# Patient Record
Sex: Female | Born: 1970 | Race: White | Hispanic: No | Smoking: Former smoker
Health system: Southern US, Community
[De-identification: ages and names within clinical notes are randomized; demographics above are authoritative.]

## PROBLEM LIST (undated history)

## (undated) DIAGNOSIS — E079 Disorder of thyroid, unspecified: Secondary | ICD-10-CM

## (undated) DIAGNOSIS — F329 Major depressive disorder, single episode, unspecified: Secondary | ICD-10-CM

## (undated) DIAGNOSIS — F502 Bulimia nervosa: Secondary | ICD-10-CM

## (undated) DIAGNOSIS — F0390 Unspecified dementia without behavioral disturbance: Secondary | ICD-10-CM

## (undated) DIAGNOSIS — D649 Anemia, unspecified: Secondary | ICD-10-CM

## (undated) DIAGNOSIS — F062 Psychotic disorder with delusions due to known physiological condition: Secondary | ICD-10-CM

## (undated) DIAGNOSIS — F419 Anxiety disorder, unspecified: Secondary | ICD-10-CM

## (undated) DIAGNOSIS — E538 Deficiency of other specified B group vitamins: Secondary | ICD-10-CM

## (undated) DIAGNOSIS — E785 Hyperlipidemia, unspecified: Secondary | ICD-10-CM

## (undated) DIAGNOSIS — E559 Vitamin D deficiency, unspecified: Secondary | ICD-10-CM

## (undated) DIAGNOSIS — F1020 Alcohol dependence, uncomplicated: Secondary | ICD-10-CM

## (undated) DIAGNOSIS — G514 Facial myokymia: Secondary | ICD-10-CM

## (undated) DIAGNOSIS — I739 Peripheral vascular disease, unspecified: Secondary | ICD-10-CM

## (undated) DIAGNOSIS — R443 Hallucinations, unspecified: Secondary | ICD-10-CM

## (undated) DIAGNOSIS — E119 Type 2 diabetes mellitus without complications: Secondary | ICD-10-CM

## (undated) DIAGNOSIS — F4324 Adjustment disorder with disturbance of conduct: Secondary | ICD-10-CM

## (undated) HISTORY — PX: OTHER SURGICAL HISTORY: SHX169

## (undated) HISTORY — DX: Unspecified dementia without behavioral disturbance: F03.90

## (undated) HISTORY — PX: REPLACEMENT TOTAL HIP W/  RESURFACING IMPLANTS: SUR1222

---

## 2013-09-15 ENCOUNTER — Emergency Department (HOSPITAL_COMMUNITY): Payer: Self-pay

## 2013-09-15 ENCOUNTER — Emergency Department (HOSPITAL_COMMUNITY)
Admission: EM | Admit: 2013-09-15 | Discharge: 2013-09-15 | Disposition: A | Discharge status: Home / Self Care | Payer: Self-pay | Attending: Emergency Medicine | Admitting: Emergency Medicine

## 2013-09-15 ENCOUNTER — Encounter (HOSPITAL_COMMUNITY): Payer: Self-pay | Admitting: Emergency Medicine

## 2013-09-15 DIAGNOSIS — R0789 Other chest pain: Secondary | ICD-10-CM | POA: Insufficient documentation

## 2013-09-15 DIAGNOSIS — R531 Weakness: Secondary | ICD-10-CM

## 2013-09-15 DIAGNOSIS — F502 Bulimia nervosa: Secondary | ICD-10-CM

## 2013-09-15 DIAGNOSIS — R5383 Other fatigue: Secondary | ICD-10-CM

## 2013-09-15 DIAGNOSIS — F17200 Nicotine dependence, unspecified, uncomplicated: Secondary | ICD-10-CM | POA: Insufficient documentation

## 2013-09-15 DIAGNOSIS — R079 Chest pain, unspecified: Secondary | ICD-10-CM | POA: Insufficient documentation

## 2013-09-15 DIAGNOSIS — Z79899 Other long term (current) drug therapy: Secondary | ICD-10-CM | POA: Insufficient documentation

## 2013-09-15 DIAGNOSIS — R5381 Other malaise: Secondary | ICD-10-CM | POA: Insufficient documentation

## 2013-09-15 DIAGNOSIS — R630 Anorexia: Secondary | ICD-10-CM | POA: Insufficient documentation

## 2013-09-15 DIAGNOSIS — E079 Disorder of thyroid, unspecified: Secondary | ICD-10-CM | POA: Insufficient documentation

## 2013-09-15 DIAGNOSIS — R632 Polyphagia: Secondary | ICD-10-CM | POA: Insufficient documentation

## 2013-09-15 HISTORY — DX: Bulimia nervosa: F50.2

## 2013-09-15 HISTORY — DX: Disorder of thyroid, unspecified: E07.9

## 2013-09-15 LAB — CBC WITH DIFFERENTIAL/PLATELET
BASOS PCT: 1 % (ref 0–1)
Basophils Absolute: 0.1 10*3/uL (ref 0.0–0.1)
EOS ABS: 0.1 10*3/uL (ref 0.0–0.7)
Eosinophils Relative: 1 % (ref 0–5)
HCT: 33.7 % — ABNORMAL LOW (ref 36.0–46.0)
HEMOGLOBIN: 10.8 g/dL — AB (ref 12.0–15.0)
Lymphocytes Relative: 20 % (ref 12–46)
Lymphs Abs: 1.3 10*3/uL (ref 0.7–4.0)
MCH: 28.4 pg (ref 26.0–34.0)
MCHC: 32 g/dL (ref 30.0–36.0)
MCV: 88.7 fL (ref 78.0–100.0)
MONOS PCT: 14 % — AB (ref 3–12)
Monocytes Absolute: 0.9 10*3/uL (ref 0.1–1.0)
NEUTROS PCT: 65 % (ref 43–77)
Neutro Abs: 4.2 10*3/uL (ref 1.7–7.7)
Platelets: 167 10*3/uL (ref 150–400)
RBC: 3.8 MIL/uL — AB (ref 3.87–5.11)
RDW: 24.1 % — ABNORMAL HIGH (ref 11.5–15.5)
WBC: 6.5 10*3/uL (ref 4.0–10.5)

## 2013-09-15 LAB — BASIC METABOLIC PANEL
Anion gap: 20 — ABNORMAL HIGH (ref 5–15)
BUN: 6 mg/dL (ref 6–23)
CO2: 22 mEq/L (ref 19–32)
CREATININE: 0.43 mg/dL — AB (ref 0.50–1.10)
Calcium: 9.7 mg/dL (ref 8.4–10.5)
Chloride: 96 mEq/L (ref 96–112)
GLUCOSE: 100 mg/dL — AB (ref 70–99)
Potassium: 3.9 mEq/L (ref 3.7–5.3)
Sodium: 138 mEq/L (ref 137–147)

## 2013-09-15 LAB — TROPONIN I: Troponin I: <0.3 ng/mL (ref <0.30)

## 2013-09-15 LAB — MAGNESIUM: MAGNESIUM: 1.8 mg/dL (ref 1.5–2.5)

## 2013-09-15 MED ORDER — SODIUM CHLORIDE 0.9 % IV BOLUS (SEPSIS)
1000.0000 mL | Freq: Once | INTRAVENOUS | Status: AC
  Administered 2013-09-15: 1000 mL via INTRAVENOUS

## 2013-09-15 MED ORDER — ONDANSETRON 4 MG PO TBDP
ORAL_TABLET | ORAL | Status: DC
Start: 2013-09-15 — End: 2017-09-16

## 2013-09-15 NOTE — ED Notes (Signed)
Weak , for 1 year,  Very emaciated  , hx bulimia,  Nausea, intermittent chest pain for 1 year.  Body aches.

## 2013-09-15 NOTE — ED Provider Notes (Signed)
CSN: [id number]     Arrival date & time 09/15/13  1916 History   First MD Initiated Contact with Patient 09/15/13 1940     Chief Complaint  Patient presents with  . Chest Pain     (Consider location/radiation/quality/duration/timing/severity/associated sxs/prior Treatment) HPI Comments: 43 year old female with history of bulimia, malnutrition, thyroid disease presents for worsening general weakness. Patient has had gradually worsening weakness and decreased appetite for the past few months especially past week. Patient is to the point where it's difficult for her to walk without assistance. Patient also said intermittent nonspecific anterior nonradiating chest pain that comes and goes for no specific reason for the past year. No blood clot or cardiac history. No cardiac risk factors except for smoker. No recent surgeries, leg swelling leg pain, fevers or chills.  Patient is a 43 y.o. female presenting with chest pain. The history is provided by the patient and a relative.  Chest Pain Associated symptoms: nausea and weakness   Associated symptoms: no abdominal pain, no back pain, no fever, no headache, no shortness of breath and not vomiting     Past Medical History  Diagnosis Date  . Thyroid disease   . Bulimia    Past Surgical History  Procedure Laterality Date  . Knee surgery     History reviewed. No pertinent family history. History  Substance Use Topics  . Smoking status: Current Every Day Smoker  . Smokeless tobacco: Not on file  . Alcohol Use: Yes   OB History   Grav Para Term Preterm Abortions TAB SAB Ect Mult Living                 Review of Systems  Constitutional: Positive for appetite change. Negative for fever and chills.  HENT: Negative for congestion.   Eyes: Negative for visual disturbance.  Respiratory: Negative for shortness of breath.   Cardiovascular: Positive for chest pain. Negative for leg swelling.  Gastrointestinal: Positive for nausea. Negative  for vomiting and abdominal pain.  Genitourinary: Negative for dysuria and flank pain.  Musculoskeletal: Positive for arthralgias. Negative for back pain, neck pain and neck stiffness.  Skin: Negative for rash.  Neurological: Positive for weakness and light-headedness. Negative for headaches.      Allergies  Review of patient's allergies indicates no known allergies.  Home Medications   Prior to Admission medications   Medication Sig Start Date End Date Taking? Authorizing Provider  Cholecalciferol (VITAMIN D) 2000 UNITS tablet Take 2,000 Units by mouth daily.   Yes Historical Provider, MD  levothyroxine (SYNTHROID, LEVOTHROID) 25 MCG tablet Take 25 mcg by mouth daily before breakfast.   Yes Historical Provider, MD  vitamin B-12 (CYANOCOBALAMIN) 1000 MCG tablet Take 1,000 mcg by mouth daily.   Yes Historical Provider, MD   BP 120/88  Pulse 113  Temp(Src) 98.2 F (36.8 C) (Oral)  Resp 20  Ht  (1.6 m)  Wt 95 lb (43.092 kg)  BMI 16.83 kg/m2  SpO2 98%  LMP 07/16/2013 Physical Exam  Nursing note and vitals reviewed. Constitutional: She is oriented to person, place, and time.  Malnourished appearance, cachectic  HENT:  Head: Normocephalic and atraumatic.  Dry meters membranes  Eyes: Right eye exhibits no discharge. Left eye exhibits no discharge.  Neck: Normal range of motion. Neck supple. No tracheal deviation present.  Cardiovascular: Regular rhythm.  Tachycardia present.   Pulmonary/Chest: Effort normal and breath sounds normal.  Abdominal: Soft. She exhibits no distension. There is no tenderness. There is no guarding.  Musculoskeletal: She exhibits no edema.  Neurological: She is alert and oriented to person, place, and time. No sensory deficit. GCS eye subscore is 4. GCS verbal subscore is 5. GCS motor subscore is 6.  Moves all extremities equal 4+ strength bilateral. Pupils equal bilateral. Patient has mild slowness response to questions but answers most  appropriate. eomfi Neck supple no meningismus  Skin: Skin is warm. No rash noted. There is pallor.  Psychiatric:  Flat affect    ED Course  Procedures (including critical care time) Labs Review Labs Reviewed  BASIC METABOLIC PANEL - Abnormal; Notable for the following:    Glucose, Bld 100 (*)    Creatinine, Ser 0.43 (*)    Anion gap 20 (*)    All other components within normal limits  CBC WITH DIFFERENTIAL - Abnormal; Notable for the following:    RBC 3.80 (*)    Hemoglobin 10.8 (*)    HCT 33.7 (*)    RDW 24.1 (*)    Monocytes Relative 14 (*)    All other components within normal limits  TROPONIN I  MAGNESIUM  TSH    Imaging Review Dg Chest 2 View  09/15/2013   CLINICAL DATA:  Weakness.  Chest pain.  EXAM: CHEST  2 VIEW  COMPARISON:  None.  FINDINGS: The heart size is normal. The lungs are clear. The visualized soft tissues and bony thorax are unremarkable.  IMPRESSION: No active cardiopulmonary disease.   Electronically Signed   By: Gennette Pac M.D.   On: 09/15/2013 20:58     EKG Interpretation   Date/Time:  Friday September 15 2013 19:37:52 EDT Ventricular Rate:  91 PR Interval:  135 QRS Duration: 67 QT Interval:  342 QTC Calculation: 421 R Axis:   70 Text Interpretation:  Sinus rhythm Right atrial enlargement Confirmed by  Ulices Maack  MD, Rosemary Pentecost ([id number]) on 09/15/2013 7:41:31 PM      MDM   Final diagnoses:  Atypical chest pain  General weakness  Bulimia   Patient with significant bulimia and non-nourishment history presents with general weakness and atypical bodyaches and chest ache. Plan for screening cardiac and blood work to look for metabolic issues with her bulimia history. IV fluids given.  Patient with mild improvement on recheck with IV fluids. Patient normally uses a walker at home and was able to use a walker in the ER. Discussed with hospitalist who agrees with recommendation for home health and PT at home as no absolute indication for admission over  the weekend. I discussed this plan with family, place order for physical therapy and home nurse assessment, family comfortable with this plan as they would prefer her to be able to stay at home with children if possible. Blood work overall unremarkable, mild anion gap likely hydration related.  Results and differential diagnosis were discussed with the patient/parent/guardian. Close follow up outpatient was discussed, comfortable with the plan.   Medications  sodium chloride 0.9 % bolus 1,000 mL (0 mLs Intravenous Stopped 09/15/13 2253)    Filed Vitals:   09/15/13 1922 09/15/13 2028  BP: 120/88 97/70  Pulse: 113 94  Temp: 98.2 F (36.8 C)   TempSrc: Oral   Resp: 20 18  Height:  (1.6 m)   Weight: 95 lb (43.092 kg)   SpO2: 98% 100%         Enid Skeens, MD 09/16/13 [phone]

## 2013-09-15 NOTE — Discharge Instructions (Signed)
Home health will contact you. Zofran for nausea.  Continue nutrition as tolerated, try shakes, et Karie Soda.  If you were given medicines take as directed.  If you are on coumadin or contraceptives realize their levels and effectiveness is altered by many different medicines.  If you have any reaction (rash, tongues swelling, other) to the medicines stop taking and see a physician.   Please follow up as directed and return to the ER or see a physician for new or worsening symptoms.  Thank you. Filed Vitals:   09/15/13 1922 09/15/13 2028  BP: 120/88 97/70  Pulse: 113 94  Temp: 98.2 F (36.8 C)   TempSrc: Oral   Resp: 20 18  Height:  (1.6 m)   Weight: 95 lb (43.092 kg)   SpO2: 98% 100%

## 2013-09-16 LAB — TSH: TSH: 8.67 u[IU]/mL — ABNORMAL HIGH (ref 0.350–4.500)

## 2015-10-20 DIAGNOSIS — F0390 Unspecified dementia without behavioral disturbance: Secondary | ICD-10-CM

## 2015-10-20 HISTORY — DX: Unspecified dementia, unspecified severity, without behavioral disturbance, psychotic disturbance, mood disturbance, and anxiety: F03.90

## 2016-01-31 ENCOUNTER — Ambulatory Visit (INDEPENDENT_AMBULATORY_CARE_PROVIDER_SITE_OTHER): Payer: Medicaid Other | Admitting: Family Medicine

## 2016-01-31 ENCOUNTER — Encounter: Payer: Self-pay | Admitting: Family Medicine

## 2016-01-31 VITALS — BP 89/58 | HR 99 | Temp 99.4°F | Ht 63.0 in | Wt 111.0 lb

## 2016-01-31 DIAGNOSIS — E039 Hypothyroidism, unspecified: Secondary | ICD-10-CM | POA: Insufficient documentation

## 2016-01-31 DIAGNOSIS — R5381 Other malaise: Secondary | ICD-10-CM

## 2016-01-31 DIAGNOSIS — F410 Panic disorder [episodic paroxysmal anxiety] without agoraphobia: Secondary | ICD-10-CM

## 2016-01-31 DIAGNOSIS — R7989 Other specified abnormal findings of blood chemistry: Secondary | ICD-10-CM

## 2016-01-31 DIAGNOSIS — F0390 Unspecified dementia without behavioral disturbance: Secondary | ICD-10-CM

## 2016-01-31 DIAGNOSIS — E46 Unspecified protein-calorie malnutrition: Secondary | ICD-10-CM | POA: Insufficient documentation

## 2016-01-31 DIAGNOSIS — F5000 Anorexia nervosa, unspecified: Secondary | ICD-10-CM | POA: Diagnosis not present

## 2016-01-31 DIAGNOSIS — R2689 Other abnormalities of gait and mobility: Secondary | ICD-10-CM | POA: Diagnosis not present

## 2016-01-31 DIAGNOSIS — E43 Unspecified severe protein-calorie malnutrition: Secondary | ICD-10-CM | POA: Diagnosis not present

## 2016-01-31 MED ORDER — MEGESTROL ACETATE 400 MG/10ML PO SUSP: 400.0000 mg | Freq: Two times a day (BID) | ORAL | 2 refills | Status: DC

## 2016-01-31 MED ORDER — ARIPIPRAZOLE 5 MG PO TABS: 5.0000 mg | ORAL_TABLET | Freq: Every day | ORAL | 2 refills | Status: DC

## 2016-01-31 NOTE — Progress Notes (Signed)
Subjective:  Patient ID: Jamiee Milholland, female    DOB: 05/26/1970  Age: 46 y.o. MRN: [id number]  CC: New Patient (Initial Visit) (pt here today to establish care and has just been released from [facility])   HPI Jabria Loos presents for Follow-up on her chronic illnesses. She has a history of alcoholism which led to problems with anorexia and bulimia. Subsequently she became malnourished. She was admitted to [facility] and stayed there for several weeks and then was transferred to nursing facility nearby. She was noted to have deficiency and several major vitamins including ADE and K. She is taking supplements at home now. At one time her weight was down to 50 pounds states her mother who is here with her. Both parents are here and states that she is having memory lapses. This is related back to her alcoholism. Mom states that she still does not eat properly. She will not eat protein foods and she had one half of a sausage biscuit all day today and by lunchtime and about 1 sausage biscuit all day yesterday. She also is having significant problems with dentition. She is located at dentist who will work with her for Medicaid and his plan is to remove all of her teeth and place dentures. Currently she denies shortness of breath and chest pain, abdominal pain nausea vomiting diarrhea. Currently actually she is very debilitated. She is in a wheelchair here. However and her home she is able to slowly move about with a walker and to make transfers on her own.  History Maelys has a past medical history of Bulimia; Dementia (10/2015); and Thyroid disease.   She has a past surgical history that includes Knee surgery.   Her family history includes Arthritis in her father, mother, paternal grandmother, and sister; Asthma in her father; COPD in her mother; Cancer in her father and mother; Depression in her mother; Diabetes in her mother; Hearing loss in her mother; Heart disease in her mother,  paternal grandfather, and paternal grandmother; Hyperlipidemia in her mother; Hypertension in her mother; Kidney disease in her mother; Miscarriages / Stillbirths in her mother and sister; Stroke in her maternal grandmother.She reports that she quit smoking about 6 months ago. She has never used smokeless tobacco. She reports that she drinks alcohol. She reports that she does not use drugs.  Current Outpatient Prescriptions on File Prior to Visit  Medication Sig Dispense Refill  . levothyroxine (SYNTHROID, LEVOTHROID) 25 MCG tablet Take 25 mcg by mouth daily before breakfast.    . ondansetron (ZOFRAN ODT) 4 MG disintegrating tablet '4mg'$  ODT q4 hours prn nausea/vomit 12 tablet 0   No current facility-administered medications on file prior to visit.     ROS Review of Systems  Constitutional: Positive for appetite change (extremely poor very low in protein). Negative for chills, diaphoresis, fatigue, fever and unexpected weight change.  HENT: Negative for congestion, ear pain, hearing loss, postnasal drip, rhinorrhea, sneezing, sore throat and trouble swallowing.   Eyes: Negative for pain.  Respiratory: Negative for cough, chest tightness and shortness of breath.   Cardiovascular: Negative for chest pain and palpitations.  Gastrointestinal: Negative for abdominal pain, constipation, diarrhea, nausea and vomiting.  Endocrine: Negative for cold intolerance, heat intolerance, polydipsia and polyphagia.  Genitourinary: Negative for dysuria and frequency.  Musculoskeletal: Positive for gait problem. Negative for arthralgias and joint swelling.  Skin: Negative for rash.  Allergic/Immunologic: Negative for environmental allergies.  Neurological: Positive for weakness. Negative for dizziness, numbness and headaches.  Psychiatric/Behavioral: Negative for agitation and dysphoric mood.    Objective:  BP (!) 89/58   Pulse 99   Temp 99.4 F (37.4 C) (Oral)   Ht '5\' 3"'$  (1.6 m)   Wt 111 lb (50.3 kg)    LMP  (LMP Unknown)   BMI 19.66 kg/m   Physical Exam  Constitutional: She is oriented to person, place, and time. She appears well-developed and well-nourished. No distress.  HENT:  Head: Normocephalic and atraumatic.  Right Ear: External ear normal.  Left Ear: External ear normal.  Nose: Nose normal.  Mouth/Throat: Oropharynx is clear and moist.  Eyes: Conjunctivae and EOM are normal. Pupils are equal, round, and reactive to light.  Neck: Normal range of motion. Neck supple. No thyromegaly present.  Cardiovascular: Normal rate, regular rhythm and normal heart sounds.   No murmur heard. Pulmonary/Chest: Effort normal and breath sounds normal. No respiratory distress. She has no wheezes. She has no rales. Right breast exhibits no inverted nipple, no mass and no tenderness. Left breast exhibits no inverted nipple, no mass and no tenderness. Breasts are symmetrical.  Abdominal: Soft. Normal appearance and bowel sounds are normal. She exhibits no distension, no abdominal bruit and no mass. There is no splenomegaly or hepatomegaly. There is no tenderness. There is no tenderness at McBurney's point and negative Murphy's sign.  Musculoskeletal: Normal range of motion. She exhibits no edema or tenderness.  Lymphadenopathy:    She has no cervical adenopathy.  Neurological: She is alert and oriented to person, place, and time. She has normal reflexes.  Skin: Skin is warm and dry. No rash noted.  Psychiatric: She has a normal mood and affect. Her behavior is normal. Judgment and thought content normal.    Assessment & Plan:   Kiaja was seen today for new patient (initial visit).  Diagnoses and all orders for this visit:  Protein-calorie malnutrition, severe ([place]) -     Vitamin B12 -     VITAMIN D 25 Hydroxy (Vit-D Deficiency, Fractures) -     Vitamin C -     Vitamin E -     Vitamin K1, Serum -     Vitamin A -     Vitamin B6 -     Vitamin B6 -     CBC with Differential/Platelet -      CMP14+EGFR -     TSH + free T4 -     Lipid panel -     Prealbumin  Dementia without behavioral disturbance, unspecified dementia type  Anorexia nervosa -     Vitamin B12 -     VITAMIN D 25 Hydroxy (Vit-D Deficiency, Fractures) -     Vitamin C -     Vitamin E -     Vitamin K1, Serum -     Vitamin A -     Vitamin B6 -     Vitamin B6 -     CBC with Differential/Platelet -     CMP14+EGFR -     TSH + free T4 -     Lipid panel -     Prealbumin  Acquired hypothyroidism -     TSH + free T4  Panic disorder  Debility -     Ambulatory referral to Physical Therapy  Impairment of balance -     Ambulatory referral to Physical Therapy  Other orders -     ARIPiprazole (ABILIFY) 5 MG tablet; Take 1 tablet (5 mg total) by mouth daily. -  megestrol (MEGACE) 400 MG/10ML suspension; Take 10 mLs (400 mg total) by mouth 2 (two) times daily.   I have discontinued Ms. Wurm's Vitamin D, sertraline, and mirtazapine. I am also having her start on ARIPiprazole and megestrol. Additionally, I am having her maintain her levothyroxine, ondansetron, vitamin E, folic acid, magnesium oxide, THERA, vitamin C, cholecalciferol, vitamin B-12, and hydrOXYzine.  Allergies as of 01/31/2016      Reactions   Aspirin Other (See Comments)   Extreme drowsiness      Medication List       Accurate as of 01/31/16  2:21 PM. Always use your most recent med list.          ARIPiprazole 5 MG tablet Commonly known as:  ABILIFY Take 1 tablet (5 mg total) by mouth daily.   cholecalciferol 1000 units tablet Commonly known as:  VITAMIN D Take 1,000 Units by mouth daily.   folic acid 1 MG tablet Commonly known as:  FOLVITE Take 1 mg by mouth daily.   hydrOXYzine 25 MG tablet Commonly known as:  ATARAX/VISTARIL Take 25 mg by mouth 3 (three) times daily.   levothyroxine 25 MCG tablet Commonly known as:  SYNTHROID, LEVOTHROID Take 25 mcg by mouth daily before breakfast.   magnesium oxide 400 MG  tablet Commonly known as:  MAG-OX Take 400 mg by mouth 2 (two) times daily.   megestrol 400 MG/10ML suspension Commonly known as:  MEGACE Take 10 mLs (400 mg total) by mouth 2 (two) times daily.   ondansetron 4 MG disintegrating tablet Commonly known as:  ZOFRAN ODT '4mg'$  ODT q4 hours prn nausea/vomit   THERA Tabs Take by mouth.   vitamin B-12 1000 MCG tablet Commonly known as:  CYANOCOBALAMIN Take 1,000 mcg by mouth daily.   vitamin C 250 MG tablet Commonly known as:  ASCORBIC ACID Take 250 mg by mouth daily.   vitamin E 400 UNIT capsule Take 400 Units by mouth daily.         Follow-up: Return in about 1 month (around 03/02/2016).  Claretta Fraise, M.D.

## 2016-02-02 ENCOUNTER — Other Ambulatory Visit: Payer: Self-pay | Admitting: Family Medicine

## 2016-02-02 MED ORDER — VITAMIN D (ERGOCALCIFEROL) 1.25 MG (50000 UNIT) PO CAPS: 50000.0000 [IU] | ORAL_CAPSULE | ORAL | 0 refills | Status: DC

## 2016-02-02 MED ORDER — LEVOTHYROXINE SODIUM 50 MCG PO TABS: 50.0000 ug | ORAL_TABLET | Freq: Every day | ORAL | 2 refills | Status: DC

## 2016-02-03 NOTE — Addendum Note (Signed)
Addended by: Almeta MonasSTONE, JANIE M on: 02/03/2016 04:37 PM   Modules accepted: Orders

## 2016-02-04 ENCOUNTER — Telehealth: Payer: Self-pay | Admitting: Family Medicine

## 2016-02-04 ENCOUNTER — Ambulatory Visit: Payer: Self-pay | Admitting: Family

## 2016-02-04 NOTE — Telephone Encounter (Signed)
Please review and advise.

## 2016-02-04 NOTE — Telephone Encounter (Signed)
Pt picked up Abilify, thought there was something to help her sleep. She is waking up having nightmares and panic attacks

## 2016-02-06 LAB — VITAMIN B6: Vitamin B6: 9.1 ug/L (ref 2.0–32.8)

## 2016-02-07 ENCOUNTER — Other Ambulatory Visit: Payer: Self-pay | Admitting: Family Medicine

## 2016-02-07 ENCOUNTER — Telehealth: Payer: Self-pay | Admitting: Family Medicine

## 2016-02-07 LAB — CBC WITH DIFFERENTIAL/PLATELET
BASOS: 0 %
Basophils Absolute: 0 10*3/uL (ref 0.0–0.2)
EOS (ABSOLUTE): 0.2 10*3/uL (ref 0.0–0.4)
EOS: 2 %
HEMATOCRIT: 38.4 % (ref 34.0–46.6)
Hemoglobin: 13 g/dL (ref 11.1–15.9)
IMMATURE GRANULOCYTES: 1 %
Immature Grans (Abs): 0.1 10*3/uL (ref 0.0–0.1)
Lymphocytes Absolute: 1.4 10*3/uL (ref 0.7–3.1)
Lymphs: 18 %
MCH: 30.3 pg (ref 26.6–33.0)
MCHC: 33.9 g/dL (ref 31.5–35.7)
MCV: 90 fL (ref 79–97)
MONOCYTES: 7 %
MONOS ABS: 0.6 10*3/uL (ref 0.1–0.9)
NEUTROS PCT: 72 %
Neutrophils Absolute: 5.6 10*3/uL (ref 1.4–7.0)
Platelets: 302 10*3/uL (ref 150–379)
RBC: 4.29 x10E6/uL (ref 3.77–5.28)
RDW: 13.7 % (ref 12.3–15.4)
WBC: 7.8 10*3/uL (ref 3.4–10.8)

## 2016-02-07 LAB — CMP14+EGFR
A/G RATIO: 1.5 (ref 1.2–2.2)
ALBUMIN: 3.9 g/dL (ref 3.5–5.5)
ALT: 19 IU/L (ref 0–32)
AST: 26 IU/L (ref 0–40)
Alkaline Phosphatase: 156 IU/L — ABNORMAL HIGH (ref 39–117)
BUN/Creatinine Ratio: 15 (ref 9–23)
BUN: 9 mg/dL (ref 6–24)
Bilirubin Total: 0.3 mg/dL (ref 0.0–1.2)
CALCIUM: 8.9 mg/dL (ref 8.7–10.2)
CO2: 23 mmol/L (ref 18–29)
Chloride: 104 mmol/L (ref 96–106)
Creatinine, Ser: 0.6 mg/dL (ref 0.57–1.00)
GFR calc Af Amer: 128 mL/min/{1.73_m2} (ref >59)
GFR, EST NON AFRICAN AMERICAN: 111 mL/min/{1.73_m2} (ref >59)
GLOBULIN, TOTAL: 2.6 g/dL (ref 1.5–4.5)
Glucose: 91 mg/dL (ref 65–99)
Potassium: 4.5 mmol/L (ref 3.5–5.2)
SODIUM: 143 mmol/L (ref 134–144)
Total Protein: 6.5 g/dL (ref 6.0–8.5)

## 2016-02-07 LAB — TSH+FREE T4
Free T4: 0.82 ng/dL (ref 0.82–1.77)
TSH: 5.46 u[IU]/mL — ABNORMAL HIGH (ref 0.450–4.500)

## 2016-02-07 LAB — VITAMIN B6: Vitamin B6: 10.8 ug/L (ref 2.0–32.8)

## 2016-02-07 LAB — VITAMIN D 25 HYDROXY (VIT D DEFICIENCY, FRACTURES): Vit D, 25-Hydroxy: 22.7 ng/mL — ABNORMAL LOW (ref 30.0–100.0)

## 2016-02-07 LAB — PREALBUMIN: PREALBUMIN: 18 mg/dL (ref 12–34)

## 2016-02-07 LAB — VITAMIN B12: Vitamin B-12: 1221 pg/mL (ref 232–1245)

## 2016-02-07 LAB — LIPID PANEL
Chol/HDL Ratio: 3 ratio units (ref 0.0–4.4)
Cholesterol, Total: 202 mg/dL — ABNORMAL HIGH (ref 100–199)
HDL: 67 mg/dL (ref >39)
LDL CALC: 118 mg/dL — AB (ref 0–99)
Triglycerides: 84 mg/dL (ref 0–149)
VLDL CHOLESTEROL CAL: 17 mg/dL (ref 5–40)

## 2016-02-07 LAB — VITAMIN E: VITAMIN E (ALPHA TOCOPHEROL): 23.8 mg/L — AB (ref 5.3–16.8)

## 2016-02-07 LAB — VITAMIN A: Vitamin A: 44 ug/dL (ref 20–65)

## 2016-02-07 LAB — VITAMIN C: Vitamin C: 1.2 mg/dL (ref 0.2–2.0)

## 2016-02-07 NOTE — Telephone Encounter (Signed)
Per pt's father, he and his wife are unable to care for pt at home [facility]

## 2016-02-10 ENCOUNTER — Encounter: Payer: Self-pay | Admitting: Physical Therapy

## 2016-02-10 ENCOUNTER — Ambulatory Visit: Payer: Medicaid Other | Attending: Family Medicine | Admitting: Physical Therapy

## 2016-02-10 DIAGNOSIS — R2681 Unsteadiness on feet: Secondary | ICD-10-CM | POA: Insufficient documentation

## 2016-02-10 DIAGNOSIS — M6281 Muscle weakness (generalized): Secondary | ICD-10-CM | POA: Insufficient documentation

## 2016-02-10 LAB — VITAMIN K1, SERUM: VITAMIN K1: 0.54 ng/mL (ref 0.13–1.88)

## 2016-02-10 NOTE — Therapy (Signed)
[facility] [place], [place], [address] Phone: [phone]   Fax:  [phone]  Physical Therapy Evaluation  Patient Details  Name: Bailey Alexander MRN: [id number] Date of Birth: 1970-01-24 Referring Provider: Claretta Fraise, MD  Encounter Date: 02/10/2016      PT End of Session - 02/10/16 1301    Visit Number 1   Number of Visits 1   Date for PT Re-Evaluation 02/10/16   PT Start Time 1302   PT Stop Time 1345   PT Time Calculation (min) 43 min   Activity Tolerance Patient tolerated treatment well   Behavior During Therapy [facility] for tasks assessed/performed      Past Medical History:  Diagnosis Date  . Bulimia   . Dementia 10/2015  . Thyroid disease     Past Surgical History:  Procedure Laterality Date  . KNEE SURGERY      There were no vitals filed for this visit.       Subjective Assessment - 02/10/16 1309    Subjective Patient states that she has had balance problems for over a year and is presently using a walker. She reports her past alcholism contributed to her weakness. She reports intermitent numbness in B toes.   Pertinent History alcoholism, eating disorders   Patient Stated Goals to get stronger so she can walk without AD   Currently in Pain? No/denies            [facility] PT Assessment - 02/10/16 0001      Assessment   Medical Diagnosis debility, impairment of balance   Referring Provider Claretta Fraise, MD   Onset Date/Surgical Date 01/20/15   Next MD Visit 02/21/16     Precautions   Precautions Fall     Balance Screen   Has the patient fallen in the past 6 months Yes  stood up too fast and got dizzy; malnourished.   How many times? 1   Has the patient had a decrease in activity level because of a fear of falling?  Yes   Is the patient reluctant to leave their home because of a fear of falling?  Yes     Home Environment   Living Environment Private residence   Living Arrangements Parent    Type of [place] Access Stairs to enter   Entrance Stairs-Number of Steps 2   Entrance Stairs-Rails Right   Home Layout Two level   [place] - 2 wheels     Prior Function   Level of Independence Independent with household mobility with device   Vocation Unemployed     Cognition   Overall Cognitive Status Within Functional Limits for tasks assessed     ROM / Strength   AROM / PROM / Strength Strength     Strength   Strength Assessment Site Hip;Knee   Right/Left Hip Right;Left   Right Hip Flexion 4+/5   Right Hip Extension 3+/5   Right Hip ABduction 4-/5   Left Hip Flexion 4+/5   Left Hip Extension 3+/5   Left Hip ABduction 4+/5   Right/Left Knee Right;Left   Right Knee Flexion 4/5   Right Knee Extension 4+/5   Left Knee Flexion 4+/5   Left Knee Extension 5/5     Ambulation/Gait   Ambulation/Gait Yes   Ambulation/Gait Assistance 6: Modified independent (Device/Increase time)   Ambulation Distance (Feet) 30 Feet   Assistive device Rolling walker   Gait Pattern --  decreased heel strike and  L hip IR   Ambulation Surface Level   Gait Comments walker set too high     Balance   Balance Assessed Yes     Standardized Balance Assessment   Standardized Balance Assessment Berg Balance Test     Berg Balance Test   Sit to Stand Able to stand  independently using hands   Standing Unsupported Able to stand 30 seconds unsupported   Sitting with Back Unsupported but Feet Supported on Floor or Stool Able to sit safely and securely 2 minutes   Stand to Sit Controls descent by using hands   Transfers Able to transfer safely, definite need of hands   Standing Unsupported with Eyes Closed Able to stand 10 seconds safely   Standing Ubsupported with Feet Together Needs help to attain position and unable to hold for 15 seconds  12 seconds   From Standing, Reach Forward with Outstretched Arm Loses balance while trying/requires external support   From Standing  Position, Pick up Object from Floor Unable to try/needs assist to keep balance   From Standing Position, Turn to Look Behind Over each Shoulder Turn sideways only but maintains balance   Turn 360 Degrees Needs assistance while turning   Standing Unsupported, Alternately Place Feet on Step/Stool Needs assistance to keep from falling or unable to try   Standing Unsupported, One Foot in Front Loses balance while stepping or standing   Standing on One Leg Unable to try or needs assist to prevent fall   Total Score 21                   [facility] Adult PT Treatment/Exercise - 02/10/16 0001      Exercises   Exercises Knee/Hip     Knee/Hip Exercises: Aerobic   Nustep L1 x 3 min                PT Education - 02/10/16 1514    Education provided Yes   Education Details sit to stand   Person(s) Educated Patient   Methods Explanation;Demonstration;Handout   Comprehension Verbalized understanding;Returned demonstration          PT Short Term Goals - 02/10/16 1517      PT SHORT TERM GOAL #1   Title I with initial HEP   Baseline no HEP   Time 1   Period Days   Status Achieved                  Plan - 02/10/16 1514    Clinical Impression Statement Patient presents with significant functional weakness and endurance deficits. She was unable to peform many tasks on the BERG Balance assessement with a score of 21/56. She ambulates with an abnormal gait pattern while using a RW and has difficulty with transfers. Patient is a fall risk.   Rehab Potential Excellent   PT Frequency One time visit   PT Treatment/Interventions Therapeutic exercise   PT Next Visit Plan Patient limited to one time visit secondary to Medicaid.   PT Home Exercise Plan sit to stand   Consulted and Agree with Plan of Care Patient      Patient will benefit from skilled therapeutic intervention in order to improve the following deficits and impairments:  Abnormal gait, Difficulty walking,  Decreased endurance, Decreased balance, Decreased strength  Visit Diagnosis: Unsteadiness on feet - Plan: PT plan of care cert/re-cert  Muscle weakness (generalized) - Plan: PT plan of care cert/re-cert     Problem List Patient Active Problem List  Diagnosis Date Noted  . Protein-calorie malnutrition, severe ([place]) 01/31/2016  . Anorexia nervosa 01/31/2016  . Acquired hypothyroidism 01/31/2016  . Dementia 10/20/2015    Madelyn Flavors PT 02/10/2016, 3:21 PM  [facility] [address] [place], [place], [address] Phone: [phone]   Fax:  [phone]  Name: Bailey Alexander MRN: [id number] Date of Birth: [birth year]   PHYSICAL THERAPY DISCHARGE SUMMARY  Visits from Start of Care: 1  Current functional level related to goals / functional outcomes: See above   Remaining deficits: See above   Education / Equipment: Sit to stand Plan: Patient agrees to discharge.  Patient goals were met. Patient is being discharged due to financial reasons.  ?????Medicaid restrictions        Madelyn Flavors, [place] 02/10/16 3:23 PM [facility] [address] [place], [place], [address] Phone: [phone]   Fax:  [phone]

## 2016-02-10 NOTE — Patient Instructions (Signed)
Sit to Stand    Sit on edge of chair, feet flat on floor and walker in front of you.  Stand upright, extending knees fully. Use your hands if you need to. Repeat 10 times per set. Do _1-3 sets per session. Do __1-2__ sessions per day.   Solon PalmJulie Dusty Wagoner, PT 02/10/16 1:45 PM [facility] [address] [place], [place], [address] Phone: [phone]   Fax:  [phone]

## 2016-02-11 ENCOUNTER — Ambulatory Visit (INDEPENDENT_AMBULATORY_CARE_PROVIDER_SITE_OTHER): Payer: Medicaid Other | Admitting: Family Medicine

## 2016-02-11 ENCOUNTER — Encounter: Payer: Self-pay | Admitting: Family Medicine

## 2016-02-11 ENCOUNTER — Other Ambulatory Visit: Payer: Self-pay | Admitting: Family Medicine

## 2016-02-11 ENCOUNTER — Telehealth: Payer: Self-pay | Admitting: Family Medicine

## 2016-02-11 VITALS — BP 108/70 | HR 99 | Temp 99.0°F | Ht 63.0 in | Wt 120.0 lb

## 2016-02-11 DIAGNOSIS — R5381 Other malaise: Secondary | ICD-10-CM

## 2016-02-11 DIAGNOSIS — F0390 Unspecified dementia without behavioral disturbance: Secondary | ICD-10-CM

## 2016-02-11 DIAGNOSIS — F5000 Anorexia nervosa, unspecified: Secondary | ICD-10-CM

## 2016-02-11 DIAGNOSIS — E039 Hypothyroidism, unspecified: Secondary | ICD-10-CM

## 2016-02-11 MED ORDER — ARIPIPRAZOLE 10 MG PO TABS: 15.0000 mg | ORAL_TABLET | Freq: Every day | ORAL | 2 refills | Status: DC

## 2016-02-11 MED ORDER — ARIPIPRAZOLE 10 MG PO TABS: 10.0000 mg | ORAL_TABLET | Freq: Every day | ORAL | 2 refills | Status: DC

## 2016-02-11 MED ORDER — MEGESTROL ACETATE 400 MG/10ML PO SUSP: 400.0000 mg | Freq: Two times a day (BID) | ORAL | 2 refills | Status: DC

## 2016-02-11 MED ORDER — TRAZODONE HCL 150 MG PO TABS: 150.0000 mg | ORAL_TABLET | Freq: Every day | ORAL | 2 refills | Status: DC

## 2016-02-11 NOTE — Progress Notes (Signed)
Subjective:  Patient ID: Bailey Alexander, female    DOB: [birth year]  Age: 46 y.o. MRN: [id number]  CC: Altered Mental Status (pt here today with parents and they state she has been very confused and doesn't get off the couch )   HPI [name] presents for Not eating very much. Parents say all aching get relieved somewhat by her to file some food like a hamburger or a couple bites of being just today. Then she lays on the couch all day needs candy. Additionally she is up much of the night moaning and yelling for her husband. They state that she has a very poor memory and that they went to the husband's house and he was there with another woman a few days ago and Madai does not register that she remembers that. Her memory has been poor in other areas. And they feel she does not have any ability to judge for herself to be independent. She did recently start on the higher dose of thyroid disease. She is taking the Abilify. Parents feels they can not coninue to care for her as they are elderly and are exhausted.  History Lindia has a past medical history of Bulimia; Dementia (10/2015); and Thyroid disease.   She has a past surgical history that includes Knee surgery.   Her family history includes Arthritis in her father, mother, paternal grandmother, and sister; Asthma in her father; COPD in her mother; Cancer in her father and mother; Depression in her mother; Diabetes in her mother; Hearing loss in her mother; Heart disease in her mother, paternal grandfather, and paternal grandmother; Hyperlipidemia in her mother; Hypertension in her mother; Kidney disease in her mother; Miscarriages / Stillbirths in her mother and sister; Stroke in her maternal grandmother.She reports that she quit smoking about 6 months ago. She has never used smokeless tobacco. She reports that she drinks alcohol. She reports that she does not use drugs.  Current Outpatient Prescriptions on File Prior to Visit  Medication Sig  Dispense Refill  . cholecalciferol (VITAMIN D) 1000 units tablet Take 1,000 Units by mouth daily.    . hydrOXYzine (ATARAX/VISTARIL) 25 MG tablet Take 25 mg by mouth 3 (three) times daily.    Marland Kitchen levothyroxine (SYNTHROID, LEVOTHROID) 50 MCG tablet Take 1 tablet (50 mcg total) by mouth daily before breakfast. 30 tablet 2  . magnesium oxide (MAG-OX) 400 MG tablet Take 400 mg by mouth 2 (two) times daily.    . Multiple Vitamin (THERA) TABS Take by mouth.    . ondansetron (ZOFRAN ODT) 4 MG disintegrating tablet 4mg  ODT q4 hours prn nausea/vomit 12 tablet 0  . vitamin B-12 (CYANOCOBALAMIN) 1000 MCG tablet Take 1,000 mcg by mouth daily.    . vitamin C (ASCORBIC ACID) 250 MG tablet Take 250 mg by mouth daily.    . Vitamin D, Ergocalciferol, (DRISDOL) 50000 units CAPS capsule Take 1 capsule (50,000 Units total) by mouth 2 (two) times a week. 16 capsule 0  . folic acid (FOLVITE) 1 MG tablet Take 1 mg by mouth daily.     No current facility-administered medications on file prior to visit.     ROS Review of Systems  Constitutional: Positive for appetite change (very poor) and unexpected weight change. Negative for fever.  HENT: Negative.   Eyes: Negative.   Respiratory: Negative.   Cardiovascular: Negative.   Gastrointestinal: Negative.   Endocrine: Negative.   Skin: Negative for rash.  Neurological: Positive for dizziness, weakness and light-headedness. Negative for speech difficulty.  Hematological: Negative for adenopathy. Does not bruise/bleed easily.  Psychiatric/Behavioral: Positive for behavioral problems, confusion, decreased concentration, dysphoric mood and sleep disturbance. Negative for hallucinations and self-injury. The patient is nervous/anxious.     Objective:  BP 108/70   Pulse 99   Temp 99 F (37.2 C) (Oral)   Ht 5\' 3"  (1.6 m)   Wt 120 lb (54.4 kg)   LMP  (LMP Unknown)   BMI 21.26 kg/m   Physical Exam  Constitutional: She is oriented to person, place, and time. No  distress.  Cachectic, sunken hollow eyes. Very quiet and reserved.  HENT:  Head: Normocephalic and atraumatic.  Right Ear: External ear normal.  Left Ear: External ear normal.  Nose: Nose normal.  Mouth/Throat: Oropharynx is clear and moist.  Eyes: Conjunctivae and EOM are normal. Pupils are equal, round, and reactive to light.  Neck: Normal range of motion. Neck supple. No thyromegaly present.  Cardiovascular: Normal rate, regular rhythm and normal heart sounds.   No murmur heard. Pulmonary/Chest: Effort normal and breath sounds normal. No respiratory distress. She has no wheezes. She has no rales.  Abdominal: Soft. Bowel sounds are normal. She exhibits no distension. There is tenderness (mild and diffuse).  Musculoskeletal: She exhibits no edema or tenderness.  Diffuse weakness. Nonfocal. Her range of motion is symmetrically diminished due to debility.  Lymphadenopathy:    She has no cervical adenopathy.  Neurological: She is alert and oriented to person, place, and time. She has normal reflexes.  Skin: Skin is warm and dry.  Psychiatric: Her mood appears anxious. Her affect is blunt and inappropriate. Her speech is not delayed and not tangential. She is slowed and withdrawn. Thought content is paranoid. Cognition and memory are impaired. She expresses impulsivity and inappropriate judgment. She is inattentive.    Assessment & Plan:   Babette Relicammy was seen today for altered mental status.  Diagnoses and all orders for this visit:  Dementia without behavioral disturbance, unspecified dementia type  Anorexia nervosa  Acquired hypothyroidism  Debility  Other orders -     ARIPiprazole (ABILIFY) 10 MG tablet; Take 1 tablet (10 mg total) by mouth daily. -     megestrol (MEGACE) 400 MG/10ML suspension; Take 10 mLs (400 mg total) by mouth 2 (two) times daily. -     traZODone (DESYREL) 150 MG tablet; Take 1 tablet (150 mg total) by mouth at bedtime. For sleep   I have discontinued Ms.  Styles's vitamin E. I have also changed her ARIPiprazole. Additionally, I am having her start on traZODone. Lastly, I am having her maintain her ondansetron, folic acid, magnesium oxide, THERA, vitamin C, cholecalciferol, vitamin B-12, hydrOXYzine, levothyroxine, Vitamin D (Ergocalciferol), and megestrol.  Meds ordered this encounter  Medications  . ARIPiprazole (ABILIFY) 10 MG tablet    Sig: Take 1 tablet (10 mg total) by mouth daily.    Dispense:  30 tablet    Refill:  2  . megestrol (MEGACE) 400 MG/10ML suspension    Sig: Take 10 mLs (400 mg total) by mouth 2 (two) times daily.    Dispense:  600 mL    Refill:  2  . traZODone (DESYREL) 150 MG tablet    Sig: Take 1 tablet (150 mg total) by mouth at bedtime. For sleep    Dispense:  30 tablet    Refill:  2   Patient is incapable of caring for herself. Her caretakers her also unable to continue to care for her. She also would benefit from physical therapy  and a more regimented dietary program where snacks were limited and foods were offered to her nutritious. I believe his admission to [facility] is indicated at this time. Parents are in agreement and will take her there for admissions and review today.  Follow-up: Return in about 1 month (around 03/13/2016).  Mechele Claude, M.D.

## 2016-02-11 NOTE — Telephone Encounter (Signed)
Please contact the patient have her take 15 mg abilify (1.5 tab)

## 2016-02-12 NOTE — Telephone Encounter (Signed)
Aware and verbalizes understanding.  

## 2016-02-13 NOTE — Telephone Encounter (Signed)
FL2  Filled out and on Dr Darlyn ReadStacks office for [employer]

## 2016-03-02 ENCOUNTER — Ambulatory Visit: Payer: Medicaid Other | Admitting: Family Medicine

## 2016-06-11 ENCOUNTER — Other Ambulatory Visit: Payer: Self-pay | Admitting: Otolaryngology

## 2016-06-11 DIAGNOSIS — H7192 Unspecified cholesteatoma, left ear: Secondary | ICD-10-CM

## 2016-06-30 ENCOUNTER — Ambulatory Visit
Admission: RE | Admit: 2016-06-30 | Discharge: 2016-06-30 | Disposition: A | Discharge status: Home / Self Care | Payer: Medicaid Other | Source: Ambulatory Visit | Attending: Otolaryngology | Admitting: Otolaryngology

## 2016-06-30 DIAGNOSIS — H7192 Unspecified cholesteatoma, left ear: Secondary | ICD-10-CM

## 2016-12-15 ENCOUNTER — Other Ambulatory Visit: Payer: Self-pay | Admitting: Hospice and Palliative Medicine

## 2016-12-15 DIAGNOSIS — Z1231 Encounter for screening mammogram for malignant neoplasm of breast: Secondary | ICD-10-CM

## 2017-01-14 ENCOUNTER — Other Ambulatory Visit: Payer: Self-pay | Admitting: Hospice and Palliative Medicine

## 2017-01-14 ENCOUNTER — Ambulatory Visit
Admission: RE | Admit: 2017-01-14 | Discharge: 2017-01-14 | Disposition: A | Discharge status: Home / Self Care | Payer: Medicaid Other | Source: Ambulatory Visit | Attending: Hospice and Palliative Medicine | Admitting: Hospice and Palliative Medicine

## 2017-01-14 DIAGNOSIS — Z1231 Encounter for screening mammogram for malignant neoplasm of breast: Secondary | ICD-10-CM

## 2017-01-14 DIAGNOSIS — N6310 Unspecified lump in the right breast, unspecified quadrant: Secondary | ICD-10-CM

## 2017-01-14 DIAGNOSIS — N6320 Unspecified lump in the left breast, unspecified quadrant: Secondary | ICD-10-CM

## 2017-01-14 DIAGNOSIS — Z872 Personal history of diseases of the skin and subcutaneous tissue: Secondary | ICD-10-CM

## 2017-01-14 DIAGNOSIS — N6011 Diffuse cystic mastopathy of right breast: Secondary | ICD-10-CM

## 2017-01-22 ENCOUNTER — Inpatient Hospital Stay
Admission: RE | Admit: 2017-01-22 | Discharge: 2017-01-22 | Disposition: A | Discharge status: Home / Self Care | Payer: Medicaid Other | Source: Ambulatory Visit | Attending: Hospice and Palliative Medicine | Admitting: Hospice and Palliative Medicine

## 2017-01-22 ENCOUNTER — Inpatient Hospital Stay: Admission: RE | Admit: 2017-01-22 | Payer: Medicaid Other | Source: Ambulatory Visit

## 2017-01-27 ENCOUNTER — Ambulatory Visit
Admission: RE | Admit: 2017-01-27 | Discharge: 2017-01-27 | Disposition: A | Discharge status: Home / Self Care | Payer: Medicaid Other | Source: Ambulatory Visit | Attending: Hospice and Palliative Medicine | Admitting: Hospice and Palliative Medicine

## 2017-01-27 ENCOUNTER — Other Ambulatory Visit: Payer: Self-pay | Admitting: Hospice and Palliative Medicine

## 2017-01-27 DIAGNOSIS — R921 Mammographic calcification found on diagnostic imaging of breast: Secondary | ICD-10-CM

## 2017-01-27 DIAGNOSIS — N6489 Other specified disorders of breast: Secondary | ICD-10-CM

## 2017-01-27 DIAGNOSIS — N6011 Diffuse cystic mastopathy of right breast: Secondary | ICD-10-CM

## 2017-01-27 DIAGNOSIS — N6310 Unspecified lump in the right breast, unspecified quadrant: Secondary | ICD-10-CM

## 2017-01-27 DIAGNOSIS — Z872 Personal history of diseases of the skin and subcutaneous tissue: Secondary | ICD-10-CM

## 2017-01-27 DIAGNOSIS — N6320 Unspecified lump in the left breast, unspecified quadrant: Secondary | ICD-10-CM

## 2017-02-02 ENCOUNTER — Other Ambulatory Visit: Payer: Self-pay | Admitting: Otolaryngology

## 2017-07-27 ENCOUNTER — Other Ambulatory Visit: Payer: Self-pay | Admitting: Internal Medicine

## 2017-07-27 DIAGNOSIS — R921 Mammographic calcification found on diagnostic imaging of breast: Secondary | ICD-10-CM

## 2017-07-27 DIAGNOSIS — N6489 Other specified disorders of breast: Secondary | ICD-10-CM

## 2017-07-28 ENCOUNTER — Other Ambulatory Visit (HOSPITAL_COMMUNITY): Payer: Self-pay | Admitting: *Deleted

## 2017-08-03 ENCOUNTER — Ambulatory Visit
Admission: RE | Admit: 2017-08-03 | Discharge: 2017-08-03 | Disposition: A | Discharge status: Home / Self Care | Payer: Medicaid Other | Source: Ambulatory Visit | Attending: Internal Medicine | Admitting: Internal Medicine

## 2017-08-03 ENCOUNTER — Other Ambulatory Visit: Payer: Self-pay | Admitting: Internal Medicine

## 2017-08-03 DIAGNOSIS — N6320 Unspecified lump in the left breast, unspecified quadrant: Secondary | ICD-10-CM

## 2017-08-03 DIAGNOSIS — R921 Mammographic calcification found on diagnostic imaging of breast: Secondary | ICD-10-CM

## 2017-08-03 DIAGNOSIS — N6489 Other specified disorders of breast: Secondary | ICD-10-CM

## 2017-09-04 ENCOUNTER — Encounter (HOSPITAL_COMMUNITY): Payer: Self-pay | Admitting: Emergency Medicine

## 2017-09-04 ENCOUNTER — Other Ambulatory Visit: Payer: Self-pay

## 2017-09-04 ENCOUNTER — Emergency Department (HOSPITAL_COMMUNITY)
Admission: EM | Admit: 2017-09-04 | Discharge: 2017-09-04 | Disposition: A | Discharge status: Home / Self Care | Payer: Medicaid Other | Source: Home / Self Care | Attending: Emergency Medicine | Admitting: Emergency Medicine

## 2017-09-04 ENCOUNTER — Emergency Department (HOSPITAL_COMMUNITY): Payer: Medicaid Other

## 2017-09-04 DIAGNOSIS — W1812XA Fall from or off toilet with subsequent striking against object, initial encounter: Secondary | ICD-10-CM

## 2017-09-04 DIAGNOSIS — F0390 Unspecified dementia without behavioral disturbance: Secondary | ICD-10-CM | POA: Insufficient documentation

## 2017-09-04 DIAGNOSIS — Z96643 Presence of artificial hip joint, bilateral: Secondary | ICD-10-CM | POA: Insufficient documentation

## 2017-09-04 DIAGNOSIS — S0003XA Contusion of scalp, initial encounter: Secondary | ICD-10-CM

## 2017-09-04 DIAGNOSIS — Z79899 Other long term (current) drug therapy: Secondary | ICD-10-CM

## 2017-09-04 DIAGNOSIS — E039 Hypothyroidism, unspecified: Secondary | ICD-10-CM

## 2017-09-04 DIAGNOSIS — Y9389 Activity, other specified: Secondary | ICD-10-CM

## 2017-09-04 DIAGNOSIS — Z87891 Personal history of nicotine dependence: Secondary | ICD-10-CM | POA: Insufficient documentation

## 2017-09-04 DIAGNOSIS — T07XXXA Unspecified multiple injuries, initial encounter: Secondary | ICD-10-CM

## 2017-09-04 DIAGNOSIS — Y92121 Bathroom in nursing home as the place of occurrence of the external cause: Secondary | ICD-10-CM | POA: Insufficient documentation

## 2017-09-04 DIAGNOSIS — Y999 Unspecified external cause status: Secondary | ICD-10-CM | POA: Insufficient documentation

## 2017-09-04 DIAGNOSIS — W19XXXA Unspecified fall, initial encounter: Secondary | ICD-10-CM

## 2017-09-04 LAB — URINALYSIS, ROUTINE W REFLEX MICROSCOPIC
Bilirubin Urine: NEGATIVE
GLUCOSE, UA: NEGATIVE mg/dL
HGB URINE DIPSTICK: NEGATIVE
KETONES UR: NEGATIVE mg/dL
Leukocytes, UA: NEGATIVE
Nitrite: NEGATIVE
PH: 8 (ref 5.0–8.0)
Protein, ur: NEGATIVE mg/dL
Specific Gravity, Urine: 1.001 — ABNORMAL LOW (ref 1.005–1.030)

## 2017-09-04 MED ORDER — ACETAMINOPHEN 500 MG PO TABS
1000.0000 mg | ORAL_TABLET | Freq: Once | ORAL | Status: AC
  Administered 2017-09-04: 1000 mg via ORAL
  Filled 2017-09-04: qty 2

## 2017-09-04 NOTE — ED Notes (Signed)
Pt's sister talked with aps worker when call returned.  Voiced no other concerns.

## 2017-09-04 NOTE — ED Notes (Signed)
Call made to APS prior to d/c.  Awaiting call back.
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
## 2017-09-04 NOTE — Discharge Instructions (Addendum)
Your vital signs are within normal limits.  The multiple bruises have been documented.  Please use Tylenol extra strength for soreness.  The CT scan of your head is negative for acute intracranial abnormality or skull fracture.  Please follow-up with Dr.Ariza for recheck.  Return to the emergency department if any changes in condition, problems, or concerns.

## 2017-09-05 ENCOUNTER — Encounter (HOSPITAL_COMMUNITY): Payer: Self-pay | Admitting: Emergency Medicine

## 2017-09-05 ENCOUNTER — Emergency Department (HOSPITAL_COMMUNITY): Payer: Medicaid Other

## 2017-09-05 ENCOUNTER — Inpatient Hospital Stay (HOSPITAL_COMMUNITY)
Admission: EM | Admit: 2017-09-05 | Discharge: 2017-09-16 | DRG: 640 | Disposition: A | Discharge status: Skilled Nursing Facility | Payer: Medicaid Other | Source: Skilled Nursing Facility | Attending: Internal Medicine | Admitting: Internal Medicine

## 2017-09-05 ENCOUNTER — Inpatient Hospital Stay (HOSPITAL_COMMUNITY): Payer: Medicaid Other

## 2017-09-05 ENCOUNTER — Other Ambulatory Visit: Payer: Self-pay

## 2017-09-05 DIAGNOSIS — R571 Hypovolemic shock: Secondary | ICD-10-CM | POA: Diagnosis not present

## 2017-09-05 DIAGNOSIS — E039 Hypothyroidism, unspecified: Secondary | ICD-10-CM | POA: Diagnosis not present

## 2017-09-05 DIAGNOSIS — I9589 Other hypotension: Secondary | ICD-10-CM | POA: Diagnosis not present

## 2017-09-05 DIAGNOSIS — F1011 Alcohol abuse, in remission: Secondary | ICD-10-CM | POA: Diagnosis present

## 2017-09-05 DIAGNOSIS — Z96643 Presence of artificial hip joint, bilateral: Secondary | ICD-10-CM | POA: Diagnosis present

## 2017-09-05 DIAGNOSIS — Z823 Family history of stroke: Secondary | ICD-10-CM | POA: Diagnosis not present

## 2017-09-05 DIAGNOSIS — E876 Hypokalemia: Secondary | ICD-10-CM | POA: Diagnosis present

## 2017-09-05 DIAGNOSIS — Z8249 Family history of ischemic heart disease and other diseases of the circulatory system: Secondary | ICD-10-CM

## 2017-09-05 DIAGNOSIS — N170 Acute kidney failure with tubular necrosis: Secondary | ICD-10-CM | POA: Diagnosis present

## 2017-09-05 DIAGNOSIS — Z841 Family history of disorders of kidney and ureter: Secondary | ICD-10-CM | POA: Diagnosis not present

## 2017-09-05 DIAGNOSIS — Z87891 Personal history of nicotine dependence: Secondary | ICD-10-CM | POA: Diagnosis not present

## 2017-09-05 DIAGNOSIS — E46 Unspecified protein-calorie malnutrition: Secondary | ICD-10-CM | POA: Diagnosis not present

## 2017-09-05 DIAGNOSIS — I340 Nonrheumatic mitral (valve) insufficiency: Secondary | ICD-10-CM | POA: Diagnosis not present

## 2017-09-05 DIAGNOSIS — E871 Hypo-osmolality and hyponatremia: Secondary | ICD-10-CM | POA: Diagnosis not present

## 2017-09-05 DIAGNOSIS — R748 Abnormal levels of other serum enzymes: Secondary | ICD-10-CM | POA: Diagnosis present

## 2017-09-05 DIAGNOSIS — Z8659 Personal history of other mental and behavioral disorders: Secondary | ICD-10-CM

## 2017-09-05 DIAGNOSIS — Z79890 Hormone replacement therapy (postmenopausal): Secondary | ICD-10-CM | POA: Diagnosis not present

## 2017-09-05 DIAGNOSIS — Z6826 Body mass index (BMI) 26.0-26.9, adult: Secondary | ICD-10-CM

## 2017-09-05 DIAGNOSIS — Z803 Family history of malignant neoplasm of breast: Secondary | ICD-10-CM | POA: Diagnosis not present

## 2017-09-05 DIAGNOSIS — Z825 Family history of asthma and other chronic lower respiratory diseases: Secondary | ICD-10-CM | POA: Diagnosis not present

## 2017-09-05 DIAGNOSIS — Z886 Allergy status to analgesic agent status: Secondary | ICD-10-CM

## 2017-09-05 DIAGNOSIS — Z8261 Family history of arthritis: Secondary | ICD-10-CM

## 2017-09-05 DIAGNOSIS — Z833 Family history of diabetes mellitus: Secondary | ICD-10-CM | POA: Diagnosis not present

## 2017-09-05 DIAGNOSIS — I214 Non-ST elevation (NSTEMI) myocardial infarction: Secondary | ICD-10-CM

## 2017-09-05 DIAGNOSIS — D638 Anemia in other chronic diseases classified elsewhere: Secondary | ICD-10-CM | POA: Diagnosis present

## 2017-09-05 DIAGNOSIS — Z818 Family history of other mental and behavioral disorders: Secondary | ICD-10-CM | POA: Diagnosis not present

## 2017-09-05 DIAGNOSIS — E875 Hyperkalemia: Secondary | ICD-10-CM | POA: Diagnosis not present

## 2017-09-05 DIAGNOSIS — F329 Major depressive disorder, single episode, unspecified: Secondary | ICD-10-CM | POA: Diagnosis present

## 2017-09-05 DIAGNOSIS — R6521 Severe sepsis with septic shock: Secondary | ICD-10-CM | POA: Diagnosis not present

## 2017-09-05 DIAGNOSIS — N179 Acute kidney failure, unspecified: Secondary | ICD-10-CM | POA: Diagnosis not present

## 2017-09-05 DIAGNOSIS — R911 Solitary pulmonary nodule: Secondary | ICD-10-CM | POA: Diagnosis present

## 2017-09-05 DIAGNOSIS — A419 Sepsis, unspecified organism: Secondary | ICD-10-CM | POA: Diagnosis not present

## 2017-09-05 DIAGNOSIS — E861 Hypovolemia: Secondary | ICD-10-CM | POA: Diagnosis not present

## 2017-09-05 DIAGNOSIS — G9340 Encephalopathy, unspecified: Secondary | ICD-10-CM | POA: Diagnosis not present

## 2017-09-05 DIAGNOSIS — K920 Hematemesis: Secondary | ICD-10-CM

## 2017-09-05 DIAGNOSIS — Z8349 Family history of other endocrine, nutritional and metabolic diseases: Secondary | ICD-10-CM | POA: Diagnosis not present

## 2017-09-05 DIAGNOSIS — F0390 Unspecified dementia without behavioral disturbance: Secondary | ICD-10-CM | POA: Diagnosis not present

## 2017-09-05 DIAGNOSIS — R579 Shock, unspecified: Secondary | ICD-10-CM | POA: Diagnosis not present

## 2017-09-05 DIAGNOSIS — E119 Type 2 diabetes mellitus without complications: Secondary | ICD-10-CM | POA: Diagnosis present

## 2017-09-05 LAB — ECHOCARDIOGRAM COMPLETE
HEIGHTINCHES: 64 in
WEIGHTICAEL: 2507.95 [oz_av]

## 2017-09-05 LAB — BLOOD GAS, ARTERIAL
ACID-BASE DEFICIT: 1.1 mmol/L (ref 0.0–2.0)
Bicarbonate: 24.4 mmol/L (ref 20.0–28.0)
Drawn by: 105551
FIO2: 28
O2 Saturation: 97.7 %
PCO2 ART: 27.6 mmHg — AB (ref 32.0–48.0)
PO2 ART: 97.7 mmHg (ref 83.0–108.0)
pH, Arterial: 7.506 — ABNORMAL HIGH (ref 7.350–7.450)

## 2017-09-05 LAB — COMPREHENSIVE METABOLIC PANEL
ALBUMIN: 2.6 g/dL — AB (ref 3.5–5.0)
ALK PHOS: 70 U/L (ref 38–126)
ALT: 37 U/L (ref 0–44)
ALT: 24 U/L (ref 0–44)
ANION GAP: 15 (ref 5–15)
AST: 71 U/L — ABNORMAL HIGH (ref 15–41)
AST: 54 U/L — ABNORMAL HIGH (ref 15–41)
Albumin: 3.4 g/dL — ABNORMAL LOW (ref 3.5–5.0)
Alkaline Phosphatase: 80 U/L (ref 38–126)
Anion gap: 15 (ref 5–15)
BUN: <5 mg/dL — ABNORMAL LOW (ref 6–20)
BUN: <5 mg/dL — ABNORMAL LOW (ref 6–20)
CALCIUM: 6.7 mg/dL — AB (ref 8.9–10.3)
CHLORIDE: 72 mmol/L — AB (ref 98–111)
CO2: 19 mmol/L — ABNORMAL LOW (ref 22–32)
CO2: 24 mmol/L (ref 22–32)
Calcium: 7.9 mg/dL — ABNORMAL LOW (ref 8.9–10.3)
Chloride: 90 mmol/L — ABNORMAL LOW (ref 98–111)
Creatinine, Ser: 0.48 mg/dL (ref 0.44–1.00)
Creatinine, Ser: 0.44 mg/dL (ref 0.44–1.00)
GFR calc non Af Amer: >60 mL/min (ref >60)
GFR calc non Af Amer: >60 mL/min (ref >60)
GLUCOSE: 143 mg/dL — AB (ref 70–99)
Glucose, Bld: 109 mg/dL — ABNORMAL HIGH (ref 70–99)
POTASSIUM: 2 mmol/L — AB (ref 3.5–5.1)
Potassium: 3.4 mmol/L — ABNORMAL LOW (ref 3.5–5.1)
SODIUM: 111 mmol/L — AB (ref 135–145)
Sodium: 124 mmol/L — ABNORMAL LOW (ref 135–145)
TOTAL PROTEIN: 4.7 g/dL — AB (ref 6.5–8.1)
Total Bilirubin: 1.8 mg/dL — ABNORMAL HIGH (ref 0.3–1.2)
Total Bilirubin: 2 mg/dL — ABNORMAL HIGH (ref 0.3–1.2)
Total Protein: 6.1 g/dL — ABNORMAL LOW (ref 6.5–8.1)

## 2017-09-05 LAB — BASIC METABOLIC PANEL
ANION GAP: 12 (ref 5–15)
ANION GAP: 11 (ref 5–15)
Anion gap: 9 (ref 5–15)
Anion gap: 18 — ABNORMAL HIGH (ref 5–15)
BUN: <5 mg/dL — ABNORMAL LOW (ref 6–20)
CALCIUM: 7.2 mg/dL — AB (ref 8.9–10.3)
CALCIUM: 7.4 mg/dL — AB (ref 8.9–10.3)
CALCIUM: 7.8 mg/dL — AB (ref 8.9–10.3)
CHLORIDE: 81 mmol/L — AB (ref 98–111)
CO2: 21 mmol/L — ABNORMAL LOW (ref 22–32)
CO2: 22 mmol/L (ref 22–32)
CO2: 14 mmol/L — ABNORMAL LOW (ref 22–32)
CO2: 18 mmol/L — ABNORMAL LOW (ref 22–32)
CREATININE: 0.83 mg/dL (ref 0.44–1.00)
Calcium: 6.7 mg/dL — ABNORMAL LOW (ref 8.9–10.3)
Chloride: 91 mmol/L — ABNORMAL LOW (ref 98–111)
Chloride: 97 mmol/L — ABNORMAL LOW (ref 98–111)
Chloride: 102 mmol/L (ref 98–111)
Creatinine, Ser: 0.35 mg/dL — ABNORMAL LOW (ref 0.44–1.00)
Creatinine, Ser: 0.53 mg/dL (ref 0.44–1.00)
Creatinine, Ser: 0.71 mg/dL (ref 0.44–1.00)
GFR calc Af Amer: >60 mL/min (ref >60)
GFR calc Af Amer: >60 mL/min (ref >60)
GLUCOSE: 174 mg/dL — AB (ref 70–99)
GLUCOSE: 146 mg/dL — AB (ref 70–99)
Glucose, Bld: 110 mg/dL — ABNORMAL HIGH (ref 70–99)
Glucose, Bld: 161 mg/dL — ABNORMAL HIGH (ref 70–99)
POTASSIUM: 1.9 mmol/L — AB (ref 3.5–5.1)
Potassium: 3.3 mmol/L — ABNORMAL LOW (ref 3.5–5.1)
Potassium: 4.8 mmol/L (ref 3.5–5.1)
Potassium: 4.4 mmol/L (ref 3.5–5.1)
SODIUM: 111 mmol/L — AB (ref 135–145)
Sodium: 125 mmol/L — ABNORMAL LOW (ref 135–145)
Sodium: 129 mmol/L — ABNORMAL LOW (ref 135–145)
Sodium: 131 mmol/L — ABNORMAL LOW (ref 135–145)

## 2017-09-05 LAB — VITAMIN B12: Vitamin B-12: 1188 pg/mL — ABNORMAL HIGH (ref 180–914)

## 2017-09-05 LAB — POCT I-STAT, CHEM 8
BUN: <3 mg/dL — ABNORMAL LOW (ref 6–20)
CALCIUM ION: 0.83 mmol/L — AB (ref 1.15–1.40)
CHLORIDE: 84 mmol/L — AB (ref 98–111)
Creatinine, Ser: 0.3 mg/dL — ABNORMAL LOW (ref 0.44–1.00)
Glucose, Bld: 160 mg/dL — ABNORMAL HIGH (ref 70–99)
HEMATOCRIT: 45 % (ref 36.0–46.0)
Hemoglobin: 15.3 g/dL — ABNORMAL HIGH (ref 12.0–15.0)
Potassium: 2.7 mmol/L — CL (ref 3.5–5.1)
SODIUM: 118 mmol/L — AB (ref 135–145)
TCO2: 21 mmol/L — AB (ref 22–32)

## 2017-09-05 LAB — PROTIME-INR
INR: 1.01
Prothrombin Time: 13.2 seconds (ref 11.4–15.2)

## 2017-09-05 LAB — URINALYSIS, ROUTINE W REFLEX MICROSCOPIC
BILIRUBIN URINE: NEGATIVE
GLUCOSE, UA: NEGATIVE mg/dL
HGB URINE DIPSTICK: NEGATIVE
Ketones, ur: NEGATIVE mg/dL
Leukocytes, UA: NEGATIVE
Nitrite: NEGATIVE
PH: 7 (ref 5.0–8.0)
Protein, ur: NEGATIVE mg/dL
SPECIFIC GRAVITY, URINE: 1.001 — AB (ref 1.005–1.030)

## 2017-09-05 LAB — TROPONIN I
TROPONIN I: 0.89 ng/mL — AB (ref <0.03)
TROPONIN I: 0.64 ng/mL — AB (ref <0.03)
Troponin I: 1.22 ng/mL (ref <0.03)
Troponin I: 0.45 ng/mL (ref <0.03)
Troponin I: 0.52 ng/mL (ref <0.03)

## 2017-09-05 LAB — GLUCOSE, CAPILLARY
GLUCOSE-CAPILLARY: 157 mg/dL — AB (ref 70–99)
GLUCOSE-CAPILLARY: 156 mg/dL — AB (ref 70–99)
Glucose-Capillary: 148 mg/dL — ABNORMAL HIGH (ref 70–99)
Glucose-Capillary: 138 mg/dL — ABNORMAL HIGH (ref 70–99)

## 2017-09-05 LAB — I-STAT BETA HCG BLOOD, ED (MC, WL, AP ONLY)

## 2017-09-05 LAB — POCT I-STAT 3, ART BLOOD GAS (G3+)
ACID-BASE DEFICIT: 2 mmol/L (ref 0.0–2.0)
Bicarbonate: 23.2 mmol/L (ref 20.0–28.0)
O2 SAT: 47 %
TCO2: 24 mmol/L (ref 22–32)
pCO2 arterial: 38.6 mmHg (ref 32.0–48.0)
pH, Arterial: 7.387 (ref 7.350–7.450)
pO2, Arterial: 26 mmHg — CL (ref 83.0–108.0)

## 2017-09-05 LAB — OSMOLALITY: OSMOLALITY: 254 mosm/kg — AB (ref 275–295)

## 2017-09-05 LAB — MRSA PCR SCREENING: MRSA BY PCR: NEGATIVE

## 2017-09-05 LAB — MAGNESIUM
MAGNESIUM: 1.2 mg/dL — AB (ref 1.7–2.4)
MAGNESIUM: 1.9 mg/dL (ref 1.7–2.4)

## 2017-09-05 LAB — CBC
HCT: 30.7 % — ABNORMAL LOW (ref 36.0–46.0)
HEMATOCRIT: 42 % (ref 36.0–46.0)
HEMOGLOBIN: 15.5 g/dL — AB (ref 12.0–15.0)
Hemoglobin: 10.7 g/dL — ABNORMAL LOW (ref 12.0–15.0)
MCH: 29.2 pg (ref 26.0–34.0)
MCH: 29.5 pg (ref 26.0–34.0)
MCHC: 36.9 g/dL — ABNORMAL HIGH (ref 30.0–36.0)
MCHC: 34.9 g/dL (ref 30.0–36.0)
MCV: 79.8 fL (ref 78.0–100.0)
MCV: 83.7 fL (ref 78.0–100.0)
PLATELETS: 276 10*3/uL (ref 150–400)
Platelets: 345 10*3/uL (ref 150–400)
RBC: 5.26 MIL/uL — ABNORMAL HIGH (ref 3.87–5.11)
RBC: 3.67 MIL/uL — ABNORMAL LOW (ref 3.87–5.11)
RDW: 14 % (ref 11.5–15.5)
RDW: 14 % (ref 11.5–15.5)
WBC: 21.1 10*3/uL — AB (ref 4.0–10.5)
WBC: 9.6 10*3/uL (ref 4.0–10.5)

## 2017-09-05 LAB — PROCALCITONIN: PROCALCITONIN: 12.39 ng/mL

## 2017-09-05 LAB — TYPE AND SCREEN
ABO/RH(D): A POS
ABO/RH(D): A POS
Antibody Screen: NEGATIVE
Antibody Screen: NEGATIVE

## 2017-09-05 LAB — CORTISOL: Cortisol, Plasma: 14.4 ug/dL

## 2017-09-05 LAB — CHLORIDE, URINE, RANDOM: Chloride Urine: <15 mmol/L

## 2017-09-05 LAB — AMMONIA: Ammonia: 11 umol/L (ref 9–35)

## 2017-09-05 LAB — NA AND K (SODIUM & POTASSIUM), RAND UR
Potassium Urine: 4 mmol/L
SODIUM UR: 12 mmol/L

## 2017-09-05 LAB — I-STAT CG4 LACTIC ACID, ED: LACTIC ACID, VENOUS: 1.06 mmol/L (ref 0.5–1.9)

## 2017-09-05 LAB — HIV ANTIBODY (ROUTINE TESTING W REFLEX): HIV Screen 4th Generation wRfx: NONREACTIVE

## 2017-09-05 LAB — SAVE SMEAR

## 2017-09-05 LAB — APTT: APTT: 27 s (ref 24–36)

## 2017-09-05 LAB — PHOSPHORUS: Phosphorus: 2.1 mg/dL — ABNORMAL LOW (ref 2.5–4.6)

## 2017-09-05 LAB — LIPASE, BLOOD: Lipase: 21 U/L (ref 11–51)

## 2017-09-05 LAB — SODIUM, URINE, RANDOM: SODIUM UR: 14 mmol/L

## 2017-09-05 LAB — FOLATE: Folate: 8.9 ng/mL (ref >5.9)

## 2017-09-05 LAB — OSMOLALITY, URINE
OSMOLALITY UR: 53 mosm/kg — AB (ref 300–900)
OSMOLALITY UR: 57 mosm/kg — AB (ref 300–900)

## 2017-09-05 LAB — BRAIN NATRIURETIC PEPTIDE: B NATRIURETIC PEPTIDE 5: 465.9 pg/mL — AB (ref 0.0–100.0)

## 2017-09-05 LAB — ABO/RH: ABO/RH(D): A POS

## 2017-09-05 LAB — TSH: TSH: 3.687 u[IU]/mL (ref 0.350–4.500)

## 2017-09-05 LAB — PREALBUMIN: Prealbumin: 9.9 mg/dL — ABNORMAL LOW (ref 18–38)

## 2017-09-05 MED ORDER — MAGNESIUM SULFATE 2 GM/50ML IV SOLN
2.0000 g | Freq: Once | INTRAVENOUS | Status: AC
  Administered 2017-09-05: 2 g via INTRAVENOUS
  Filled 2017-09-05: qty 50

## 2017-09-05 MED ORDER — FOLIC ACID 5 MG/ML IJ SOLN
1.0000 mg | Freq: Every day | INTRAMUSCULAR | Status: DC
  Administered 2017-09-05 – 2017-09-07 (×3): 1 mg via INTRAVENOUS
  Filled 2017-09-05 (×4): qty 0.2

## 2017-09-05 MED ORDER — SODIUM CHLORIDE 0.9 % IV SOLN
250.0000 mL | INTRAVENOUS | Status: DC | PRN
  Administered 2017-09-08: 250 mL via INTRAVENOUS

## 2017-09-05 MED ORDER — SODIUM CHLORIDE 0.9 % IV SOLN
50.0000 ug/h | INTRAVENOUS | Status: DC
  Administered 2017-09-05: 50 ug/h via INTRAVENOUS
  Filled 2017-09-05 (×4): qty 1

## 2017-09-05 MED ORDER — SODIUM CHLORIDE 0.9 % IV SOLN
80.0000 mg | Freq: Once | INTRAVENOUS | Status: AC
  Administered 2017-09-05: 02:00:00 80 mg via INTRAVENOUS
  Filled 2017-09-05: qty 80

## 2017-09-05 MED ORDER — ADULT MULTIVITAMIN W/MINERALS CH
1.0000 | ORAL_TABLET | Freq: Every day | ORAL | Status: DC
  Administered 2017-09-05 – 2017-09-16 (×12): 1 via ORAL
  Filled 2017-09-05 (×11): qty 1

## 2017-09-05 MED ORDER — DEXTROSE 5 % IV SOLN
INTRAVENOUS | Status: AC
  Administered 2017-09-05: 17:00:00 via INTRAVENOUS

## 2017-09-05 MED ORDER — ORAL CARE MOUTH RINSE
15.0000 mL | Freq: Two times a day (BID) | OROMUCOSAL | Status: DC
  Administered 2017-09-05 – 2017-09-08 (×5): 15 mL via OROMUCOSAL

## 2017-09-05 MED ORDER — OCTREOTIDE ACETATE 100 MCG/ML IJ SOLN
INTRAMUSCULAR | Status: AC
  Filled 2017-09-05: qty 1

## 2017-09-05 MED ORDER — OCTREOTIDE ACETATE 500 MCG/ML IJ SOLN
INTRAMUSCULAR | Status: AC
  Filled 2017-09-05: qty 1

## 2017-09-05 MED ORDER — SODIUM CHLORIDE 0.9 % IV BOLUS
1000.0000 mL | Freq: Once | INTRAVENOUS | Status: AC
  Administered 2017-09-05: 1000 mL via INTRAVENOUS

## 2017-09-05 MED ORDER — IOHEXOL 300 MG/ML  SOLN
75.0000 mL | Freq: Once | INTRAMUSCULAR | Status: AC | PRN
  Administered 2017-09-05: 75 mL via INTRAVENOUS

## 2017-09-05 MED ORDER — POTASSIUM CHLORIDE 10 MEQ/100ML IV SOLN
10.0000 meq | INTRAVENOUS | Status: DC
  Filled 2017-09-05 (×2): qty 100

## 2017-09-05 MED ORDER — OCTREOTIDE LOAD VIA INFUSION
50.0000 ug | Freq: Once | INTRAVENOUS | Status: AC
  Administered 2017-09-05: 50 ug via INTRAVENOUS
  Filled 2017-09-05: qty 25

## 2017-09-05 MED ORDER — POTASSIUM CHLORIDE 10 MEQ/100ML IV SOLN
10.0000 meq | INTRAVENOUS | Status: AC
  Administered 2017-09-05 (×4): 10 meq via INTRAVENOUS
  Filled 2017-09-05 (×4): qty 100

## 2017-09-05 MED ORDER — ONDANSETRON HCL 4 MG/2ML IJ SOLN
4.0000 mg | Freq: Once | INTRAMUSCULAR | Status: AC
  Administered 2017-09-05: 4 mg via INTRAVENOUS
  Filled 2017-09-05: qty 2

## 2017-09-05 MED ORDER — PANTOPRAZOLE SODIUM 40 MG IV SOLR
INTRAVENOUS | Status: AC
  Filled 2017-09-05: qty 80

## 2017-09-05 MED ORDER — VANCOMYCIN HCL IN DEXTROSE 1-5 GM/200ML-% IV SOLN
1000.0000 mg | Freq: Two times a day (BID) | INTRAVENOUS | Status: DC
  Administered 2017-09-05 – 2017-09-07 (×5): 1000 mg via INTRAVENOUS
  Filled 2017-09-05 (×5): qty 200

## 2017-09-05 MED ORDER — INSULIN ASPART 100 UNIT/ML ~~LOC~~ SOLN
0.0000 [IU] | SUBCUTANEOUS | Status: DC
  Administered 2017-09-06 – 2017-09-14 (×10): 2 [IU] via SUBCUTANEOUS

## 2017-09-05 MED ORDER — PIPERACILLIN-TAZOBACTAM 3.375 G IVPB
3.3750 g | Freq: Three times a day (TID) | INTRAVENOUS | Status: DC
  Administered 2017-09-05 – 2017-09-09 (×12): 3.375 g via INTRAVENOUS
  Filled 2017-09-05 (×13): qty 50

## 2017-09-05 MED ORDER — LEVOTHYROXINE SODIUM 50 MCG PO TABS
50.0000 ug | ORAL_TABLET | Freq: Every day | ORAL | Status: DC
  Administered 2017-09-05 – 2017-09-16 (×12): 50 ug via ORAL
  Filled 2017-09-05 (×12): qty 1

## 2017-09-05 MED ORDER — PHENYLEPHRINE HCL-NACL 10-0.9 MG/250ML-% IV SOLN
0.0000 ug/min | INTRAVENOUS | Status: DC
  Administered 2017-09-05: 20 ug/min via INTRAVENOUS
  Administered 2017-09-05: 30 ug/min via INTRAVENOUS
  Administered 2017-09-05: 40 ug/min via INTRAVENOUS
  Administered 2017-09-05: 30 ug/min via INTRAVENOUS
  Administered 2017-09-06: 40 ug/min via INTRAVENOUS
  Administered 2017-09-06 – 2017-09-07 (×4): 30 ug/min via INTRAVENOUS
  Filled 2017-09-05 (×12): qty 250

## 2017-09-05 MED ORDER — SODIUM CHLORIDE 0.9 % IV SOLN
8.0000 mg/h | INTRAVENOUS | Status: DC
  Administered 2017-09-05: 8 mg/h via INTRAVENOUS
  Filled 2017-09-05 (×4): qty 80

## 2017-09-05 MED ORDER — VANCOMYCIN HCL IN DEXTROSE 1-5 GM/200ML-% IV SOLN
1000.0000 mg | Freq: Once | INTRAVENOUS | Status: AC
  Administered 2017-09-05: 1000 mg via INTRAVENOUS
  Filled 2017-09-05: qty 200

## 2017-09-05 MED ORDER — POTASSIUM CHLORIDE 10 MEQ/100ML IV SOLN
10.0000 meq | INTRAVENOUS | Status: DC
  Administered 2017-09-05 (×2): 10 meq via INTRAVENOUS
  Filled 2017-09-05: qty 100

## 2017-09-05 MED ORDER — POTASSIUM CHLORIDE CRYS ER 20 MEQ PO TBCR
40.0000 meq | EXTENDED_RELEASE_TABLET | Freq: Once | ORAL | Status: AC
  Administered 2017-09-05: 40 meq via ORAL
  Filled 2017-09-05: qty 2

## 2017-09-05 MED ORDER — MAGNESIUM SULFATE 2 GM/50ML IV SOLN
2.0000 g | Freq: Once | INTRAVENOUS | Status: AC
  Administered 2017-09-05: 2 g via INTRAVENOUS

## 2017-09-05 MED ORDER — THIAMINE HCL 100 MG/ML IJ SOLN
100.0000 mg | Freq: Every day | INTRAMUSCULAR | Status: DC
  Administered 2017-09-05 – 2017-09-07 (×3): 100 mg via INTRAVENOUS
  Filled 2017-09-05 (×3): qty 2

## 2017-09-05 MED ORDER — SODIUM CHLORIDE 0.9 % IV SOLN: INTRAVENOUS | Status: DC

## 2017-09-05 MED ORDER — POTASSIUM CHLORIDE CRYS ER 20 MEQ PO TBCR
40.0000 meq | EXTENDED_RELEASE_TABLET | Freq: Once | ORAL | Status: AC
  Administered 2017-09-05: 40 meq via ORAL
  Filled 2017-09-05 (×2): qty 2

## 2017-09-05 MED ORDER — POTASSIUM PHOSPHATES 15 MMOLE/5ML IV SOLN
10.0000 mmol | Freq: Once | INTRAVENOUS | Status: DC
  Administered 2017-09-05: 10 mmol via INTRAVENOUS
  Filled 2017-09-05: qty 3.33

## 2017-09-05 MED ORDER — PIPERACILLIN-TAZOBACTAM 3.375 G IVPB 30 MIN
3.3750 g | Freq: Once | INTRAVENOUS | Status: AC
  Administered 2017-09-05: 3.375 g via INTRAVENOUS
  Filled 2017-09-05: qty 50

## 2017-09-05 MED ORDER — SODIUM CHLORIDE 0.9 % IV SOLN
1.0000 g | Freq: Once | INTRAVENOUS | Status: AC
  Administered 2017-09-05: 1 g via INTRAVENOUS
  Filled 2017-09-05 (×2): qty 10

## 2017-09-05 NOTE — ED Notes (Signed)
Date and time results received: 09/05/17 2:11 AM    Test: Na+, K+ Critical Value: Na+ 111. K+ 1.9  Name of Provider Notified: Rancour Orders Received? Or Actions Taken?: MD notified

## 2017-09-05 NOTE — ED Notes (Signed)
CRITICAL VALUE ALERT  Critical Value:  Sodium 111  Date & Time Notied:  09/05/17 @ 0130 Provider Notified:Rancour Orders Received/Actions taken: Redraw and Recheck

## 2017-09-05 NOTE — Progress Notes (Addendum)
CAll from RN  - remote location   S: lyte abn - low K, low mag, low calcium , low phos ORiented and wants to eat Low Na - self correcting fast  PCT high   Anti-infectives (From admission, onward)   Start     Dose/Rate Route Frequency Ordered Stop   09/05/17 1000  vancomycin (VANCOCIN) IVPB 1000 mg/200 mL premix     1,000 mg 200 mL/hr over 60 Minutes Intravenous Every 12 hours 09/05/17 0737     09/05/17 1000  piperacillin-tazobactam (ZOSYN) IVPB 3.375 g     3.375 g 12.5 mL/hr over 240 Minutes Intravenous Every 8 hours 09/05/17 0737     09/05/17 0215  vancomycin (VANCOCIN) IVPB 1000 mg/200 mL premix     1,000 mg 200 mL/hr over 60 Minutes Intravenous  Once 09/05/17 0211 09/05/17 0253   09/05/17 0200  piperacillin-tazobactam (ZOSYN) IVPB 3.375 g     3.375 g 100 mL/hr over 30 Minutes Intravenous  Once 09/05/17 0145 09/05/17 0259        PULMONARY Recent Labs  Lab 09/05/17 0300 09/05/17 0710 09/05/17 0931  PHART 7.506*  --  7.387  PCO2ART 27.6*  --  38.6  PO2ART 97.7  --  26.0*  HCO3 24.4  --  23.2  TCO2  --  21* 24  O2SAT 97.7  --  47.0    CBC Recent Labs  Lab 09/05/17 0020 09/05/17 0709 09/05/17 0710  HGB 15.5* 10.7* 15.3*  HCT 42.0 30.7* 45.0  WBC 21.1* 9.6  --   PLT 345 276  --     COAGULATION Recent Labs  Lab 09/05/17 0035  INR 1.01    CARDIAC   Recent Labs  Lab 09/05/17 0035 09/05/17 0138 09/05/17 0709  TROPONINI 1.22* 0.89* 0.64*   No results for input(s): PROBNP in the last 168 hours.   CHEMISTRY Recent Labs  Lab 09/05/17 0020 09/05/17 0138 09/05/17 0709 09/05/17 0710 09/05/17 1055  NA 111* 111* 124* 118* 125*  K 2.0* 1.9* 3.4* 2.7* 3.3*  CL 72* 81* 90* 84* 91*  CO2 24 21* 19*  --  22  GLUCOSE 109* 110* 143* 160* 174*  BUN <5* <5* <5* <3* <5*  CREATININE 0.48 0.35* 0.44 0.30* 0.53  CALCIUM 7.9* 6.7* 6.7*  --  7.2*  MG  --  1.2* 1.9  --   --   PHOS  --   --  2.1*  --   --    Estimated Creatinine Clearance: 85.9 mL/min (by C-G  formula based on SCr of 0.53 mg/dL).   LIVER Recent Labs  Lab 09/05/17 0020 09/05/17 0035 09/05/17 0709  AST 71*  --  54*  ALT 37  --  24  ALKPHOS 80  --  70  BILITOT 1.8*  --  2.0*  PROT 6.1*  --  4.7*  ALBUMIN 3.4*  --  2.6*  INR  --  1.01  --      INFECTIOUS Recent Labs  Lab 09/05/17 0145 09/05/17 0709  LATICACIDVEN 1.06  --   PROCALCITON  --  12.39     ENDOCRINE CBG (last 3)  Recent Labs    09/05/17 0530 09/05/17 0819 09/05/17 1253  GLUCAP 148* 157* 156*         IMAGING x48h  - image(s) personally visualized  -   highlighted in bold Ct Abdomen Pelvis Wo Contrast  Result Date: 09/05/2017 CLINICAL DATA:  Nausea and vomiting with abdominal distention. Elevated creatinine. EXAM: CT ABDOMEN AND PELVIS WITHOUT  CONTRAST TECHNIQUE: Multidetector CT imaging of the abdomen and pelvis was performed following the standard protocol without IV contrast. COMPARISON:  None. FINDINGS: Lower chest: Atelectasis in both lung bases. 5.5 mm nodule in the anterior right middle lung. Hepatobiliary: No focal liver abnormality is seen. No gallstones, gallbladder wall thickening, or biliary dilatation. Pancreas: Unremarkable. No pancreatic ductal dilatation or surrounding inflammatory changes. Spleen: Normal in size without focal abnormality. Adrenals/Urinary Tract: Adrenal glands are unremarkable. Kidneys are normal, without renal calculi, focal lesion, or hydronephrosis. Bladder is unremarkable. Stomach/Bowel: Stomach, small bowel, and colon are mostly decompressed. No inflammatory changes although evaluation of the bowel wall is limited due to under distention. Appendix is normal. Vascular/Lymphatic: Aortic atherosclerosis. No enlarged abdominal or pelvic lymph nodes. Flattening of the inferior vena cava suggest hypovolemia. Reproductive: Uterus and bilateral adnexa are unremarkable. Other: No abdominal wall hernia or abnormality. No abdominopelvic ascites. Musculoskeletal: Schmorl's nodes  at L2 and L3. Degenerative changes in the lumbar spine. Degenerative changes in the left hip. Postoperative right hip arthroplasty. No destructive bone lesions. IMPRESSION: 1. No acute process demonstrated in the abdomen or pelvis. No evidence of bowel obstruction or inflammation. 2. 5.5 mm nodule in the right middle lung. No follow-up needed if patient is low-risk. Non-contrast chest CT can be considered in 12 months if patient is high-risk. This recommendation follows the consensus statement: Guidelines for Management of Incidental Pulmonary Nodules Detected on CT Images: From the Fleischner Society 2017; Radiology 2017; 284:228-243. 3. Aortic atherosclerosis. 4. Flattened IVC suggest hypovolemia. Electronically Signed   By: Burman NievesWilliam  Stevens M.D.   On: 09/05/2017 04:20   Ct Head Wo Contrast  Result Date: 09/04/2017 CLINICAL DATA:  47 year old female with acute headache following fall yesterday. Initial encounter. EXAM: CT HEAD WITHOUT CONTRAST TECHNIQUE: Contiguous axial images were obtained from the base of the skull through the vertex without intravenous contrast. COMPARISON:  06/30/2016 temporal bone CT FINDINGS: Brain: No evidence of acute infarction, hemorrhage, hydrocephalus, extra-axial collection or mass lesion/mass effect. Vascular: No hyperdense vessel or unexpected calcification. Skull: No acute abnormality. Sinuses/Orbits: No acute abnormality. LEFT temporal/mastoid surgical changes again noted with LEFT mastoid effusion. Other: None IMPRESSION: 1. No evidence of acute intracranial abnormality Electronically Signed   By: Harmon PierJeffrey  Hu M.D.   On: 09/04/2017 11:45   Dg Chest Portable 1 View  Result Date: 09/05/2017 CLINICAL DATA:  Coffee-ground emesis. Sepsis. EXAM: PORTABLE CHEST 1 VIEW COMPARISON:  09/15/2013 FINDINGS: Shallow inspiration. Heart size and pulmonary vascularity are normal. No airspace disease or consolidation in the lungs. No blunting of costophrenic angles. No pneumothorax.  Mediastinal contours appear intact. IMPRESSION: No active disease. Electronically Signed   By: Burman NievesWilliam  Stevens M.D.   On: 09/05/2017 02:37   Dg Abd Portable 2 Views  Result Date: 09/05/2017 CLINICAL DATA:  Coffee ground emesis. EXAM: PORTABLE ABDOMEN - 2 VIEW COMPARISON:  None. FINDINGS: Examination is technically limited due to under penetration. As visualized, there does not appear to be any significant large or small bowel distention although gas-filled bowel loops are present. No radiopaque stones are identified. No free intra-abdominal air. Postoperative changes in the right hip. IMPRESSION: Technically limited study. No gross evidence of bowel obstruction. Electronically Signed   By: Burman NievesWilliam  Stevens M.D.   On: 09/05/2017 02:38    A/p  Electrolyte imalance - replace mag, phos, Calcium Low Na - self corectd fast -> monitor mental status High PCT - continue abx Oriented - can eat [place]  On neo     Dr. Kalman ShanMurali Oland Arquette,  M.D., F.C.C.P Pulmonary and Critical Care Medicine Staff Physician, [facility] Director - Interstitial Lung Disease  Program  Pulmonary Fibrosis [facility] [place], [place], [address]  Pager: [phone], If no answer or between  15:00h - 7:00h: call [phone] Telephone: [phone]

## 2017-09-05 NOTE — ED Notes (Signed)
Neosynephrine changed to 25 mcg/hr

## 2017-09-05 NOTE — Progress Notes (Signed)
RT spoke with pulmonology, Bailey BadKathryn Whiteheart, NP, about the patient's venous blood gas results and inability to obtain an ABG. She was satisfied with the results of the VBG with not other tests to be performed at this time.

## 2017-09-05 NOTE — ED Notes (Signed)
Date and time results received: 09/05/17 0219    Test: Troponin Critical Value: 0.89  Name of Provider Notified: Rancour  Orders Received? Or Actions Taken?: MD notified

## 2017-09-05 NOTE — H&P (Signed)
HISTORY & PHYSICAL  Patient Name: Bailey Alexander MRN: [id number] DOB: [birth year]    ADMISSION DATE:  09/05/2017 DATE OF SERVICE: 09/05/2017  CHIEF COMPLAINT: Hyponatremia, hypokalemia   HISTORY OF PRESENT ILLNESS  This 47 y.o. Caucasian female reformed smoker and nursing home resident transferred to [facility] Emergency Department from [facility] for further evaluation and management of severe hyponatremia and hypokalemia.  The patient originally presented to [facility] at the family's request after a reported fall.  The patient was evaluated any pain in the hospital and then released to return back to her nursing facility.  However, in the same day, she experienced another fall, had bouts of emesis and was experiencing confusion and demonstrated combative behavior, prompting return to [facility].    At the time of clinical interview, the patient has arrived at the ICU (62M).  She is on phenylephrine for blood pressure support.  She has been receiving intravenous saline infusions.  She is awake, alert but oriented only to self.  Nonetheless, the patient is able to report that she "pees all the time".  She believes this is the reason why she is in the hospital.  The patient's sister is at the bedside and is able to provide a significant amount of clinical information.  Of note, the patient clearly has multiple possible contributing factors for her hyponatremia.  The patient has a remote history of alcohol abuse.  While she has been sober for over a year now, she continues to drink very large volumes of liquids.  She has a history of profound malnutrition, which basically started when she started using alcohol at the age of 47.  Although she is now sober, she continues to be anorexic.  Indeed, the patient has a history of anorexia and bulimia.  At this time, the chronicity of the patient's hyponatremia is unknown; however, review of available labs through  the electronic medical records suggests that as recently as July 2018, she had a normal serum sodium, normal potassium, normal creatinine (albeit with an abnormally low BUN).  He has a history of alcohol abuse; however, she has been institutionalized since January 2018.  Home medications are listed below and include Abilify and Benadryl.  She is also on Synthroid.   REVIEW OF SYSTEMS Constitutional: History of unintentional weight loss. No night sweats. No fever. No chills. No fatigue. HEENT: No headaches, dysphagia, sore throat, otalgia, nasal congestion, PND CV:  No chest pain, orthopnea, PND, swelling in lower extremities, palpitations GI: She had a bout of emesis today, which was brown in color.  This was conveyed to us from [facility] as suspicious for upper GI bleed; however, the sister points out that this very well may have been brown in color due to caramel coloring of her soft drinks.  No abdominal pain, nausea, vomiting, diarrhea, change in bowel pattern, anorexia Resp: No DOE, rest dyspnea, cough, mucus, hemoptysis, wheezing  GU: no dysuria, change in color of urine, no urgency or frequency.  No flank pain. MS: Walks with a walker.  No joint pain or swelling. No myalgias,  No decreased range of motion.  Psych:  No change in mood or affect. No memory loss. Skin: no rash or lesions.   PAST MEDICAL/SURGICAL/SOCIAL/FAMILY HISTORIES   Past Medical History:  Diagnosis Date  . Bulimia   . Dementia 10/2015  . Thyroid disease     Past Surgical History:  Procedure Laterality Date  . arm surgery Right   .  REPLACEMENT TOTAL HIP W/  RESURFACING IMPLANTS Bilateral     Social History   Tobacco Use  . Smoking status: Former Smoker    Types: Cigarettes    Last attempt to quit: 07/31/2015    Years since quitting: 2.1  . Smokeless tobacco: Never Used  Substance Use Topics  . Alcohol use: Not Currently    Family History  Problem Relation Age of Onset  . Arthritis Mother    . COPD Mother   . Cancer Mother        Roda Shutters Cancer  . Depression Mother        Bipolar  . Diabetes Mother   . Hearing loss Mother   . Heart disease Mother   . Hyperlipidemia Mother   . Hypertension Mother   . Kidney disease Mother   . Miscarriages / [place] Mother   . Arthritis Father   . Asthma Father   . Cancer Father        prostate  . Arthritis Sister   . Breast cancer Sister 74  . Stroke Maternal Grandmother   . Arthritis Paternal Grandmother   . Heart disease Paternal Grandmother   . Heart disease Paternal Grandfather   . Miscarriages / Stillbirths Sister     Allergies  Allergen Reactions  . Aspirin Other (See Comments)    Extreme drowsiness    Prior to Admission medications   Medication Sig Start Date End Date Taking? Authorizing Provider  ARIPiprazole (ABILIFY) 10 MG tablet Take 1.5 tablets (15 mg total) by mouth daily. 02/11/16   Mechele Claude, MD  hydrOXYzine (ATARAX/VISTARIL) 25 MG tablet Take 25 mg by mouth 3 (three) times daily.    [provider]  levothyroxine (SYNTHROID, LEVOTHROID) 50 MCG tablet Take 1 tablet (50 mcg total) by mouth daily before breakfast. 02/02/16   Mechele Claude, MD  magnesium oxide (MAG-OX) 400 MG tablet Take 400 mg by mouth 2 (two) times daily. 07/31/15   [provider]  megestrol (MEGACE) 400 MG/10ML suspension Take 10 mLs (400 mg total) by mouth 2 (two) times daily. 02/11/16   Mechele Claude, MD  Multiple Vitamin (THERA) TABS Take by mouth.    [provider]  ondansetron (ZOFRAN ODT) 4 MG disintegrating tablet 4mg  ODT q4 hours prn nausea/vomit 09/15/13   Blane Ohara, MD  traZODone (DESYREL) 150 MG tablet Take 1 tablet (150 mg total) by mouth at bedtime. For sleep 02/11/16   Mechele Claude, MD  vitamin B-12 (CYANOCOBALAMIN) 1000 MCG tablet Take 1,000 mcg by mouth daily.    [provider]  Vitamin D, Ergocalciferol, (DRISDOL) 50000 units CAPS capsule Take 1 capsule (50,000 Units total) by  mouth 2 (two) times a week. 02/03/16   Mechele Claude, MD    Current Facility-Administered Medications  Medication Dose Route Frequency Provider Last Rate Last Dose  . 0.9 %  sodium chloride infusion   Intravenous Continuous Rancour, Stephen, MD      . 0.9 %  sodium chloride infusion  250 mL Intravenous PRN Tobey Grim, NP      . folic acid injection 1 mg  1 mg Intravenous Daily Jovita Kussmaul M, NP      . levothyroxine (SYNTHROID, LEVOTHROID) tablet 50 mcg  50 mcg Oral QAC breakfast Jovita Kussmaul M, NP      . octreotide (SANDOSTATIN) 500 mcg in sodium chloride 0.9 % 250 mL (2 mcg/mL) infusion  50 mcg/hr Intravenous Continuous Rancour, Jeannett Senior, MD 25 mL/hr at 09/05/17 0156 50 mcg/hr at 09/05/17 0156  .  pantoprazole (PROTONIX) 80 mg in sodium chloride 0.9 % 250 mL (0.32 mg/mL) infusion  8 mg/hr Intravenous Continuous Rancour, Stephen, MD 25 mL/hr at 09/05/17 0216 8 mg/hr at 09/05/17 0216  . phenylephrine (NEOSYNEPHRINE) 10-0.9 MG/250ML-% infusion  0-200 mcg/min Intravenous Titrated Tobey Grim, NP 30 mL/hr at 09/05/17 0232 20 mcg/min at 09/05/17 0232  . potassium chloride 10 mEq in 100 mL IVPB  10 mEq Intravenous Q1 Hr x 4 Eubanks, Katalina M, NP      . thiamine (B-1) injection 100 mg  100 mg Intravenous Daily Tobey Grim, NP        VITAL SIGNS: BP (!) 83/57   Pulse 85   Temp 97.7 F (36.5 C) (Oral)   Resp (!) 25   Ht 5\' 4"  (1.626 m)   Wt 68 kg   LMP  (LMP Unknown)   SpO2 100%   BMI 25.75 kg/m   INTAKE / OUTPUT: No intake/output data recorded.  PHYSICAL EXAMINATION: GENERAL: Awake, alert.  Disoriented to time and place.  Masked facies.  Flat affect.  Cooperative.  No acute distress. HEAD: normocephalic, atraumatic EYE: PERRLA, EOM intact, no scleral icterus, no pallor.  No lid lag. NOSE: nares are patent. No polyps. No exudate.  THROAT/ORAL CAVITY: Edentulous. No oral thrush. No exudate. Mucous membranes are moist. No tonsillar enlargement. Mallampati  class II (hard and soft palate, upper portion of tonsils anduvula visible) airway. NECK: supple, no thyromegaly, no JVD, no lymphadenopathy. Trachea midline. CHEST/LUNG: symmetric in development and expansion. Good air entry. no crackles. No wheezes. HEART: Regular S1 and S2 without murmur, rub or gallop. ABDOMEN: soft, nontender, nondistended. Normoactive bowel sounds. No rebound. No guarding. No hepatosplenomegaly. EXTREMITIES: Edema: Trace and non-pitting. No cyanosis. No clubbing. 2+ DP pulses LYMPHATIC: no cervical/axillary/inguinal lymph nodes appreciated MUSCULOSKELETAL: No point tenderness. Mild bulk atrophy. Joints: no joint tenderness, deformity or swelling. SKIN:  No rash or lesion. NEUROLOGIC: Doll's eyes intact. Corneal reflex intact. Spontaneous respirations intact. Cranial nerves II-XII are grossly symmetric and physiologic. Babinski absent. No sensory deficit. Motor: 5/5 @ RUE, 5/5 @ LUE, 5/5 @ RLL,  5/5 @ LLL.  DTR: 2+ @ R biceps, 2+ @ L biceps, 2+ @ R patellar,  2+ @ L patellar. No cerebellar signs. Gait was not assessed.   LABS:  BASIC METABOLIC PROFILE Recent Labs  Lab 09/05/17 0020 09/05/17 0138  NA 111* 111*  K 2.0* 1.9*  CL 72* 81*  CO2 24 21*  BUN <5* <5*  CREATININE 0.48 0.35*  GLUCOSE 109* 110*  CALCIUM 7.9* 6.7*  MG  --  1.2*    Liver Enzymes Recent Labs  Lab 09/05/17 0020  AST 71*  ALT 37  ALKPHOS 80  BILITOT 1.8*  ALBUMIN 3.4*    CBC Recent Labs  Lab 09/05/17 0020  WBC 21.1*  HGB 15.5*  HCT 42.0  PLT 345    COAGULATION STUDIES Recent Labs  Lab 09/05/17 0035  APTT 27  INR 1.01    SEPSIS MARKERS Recent Labs  Lab 09/05/17 0145  LATICACIDVEN 1.06    ABG Recent Labs  Lab 09/05/17 0300  PHART 7.506*  PCO2ART 27.6*  PO2ART 97.7    Cardiac Enzymes Recent Labs  Lab 09/05/17 0035 09/05/17 0138  TROPONINI 1.22* 0.89*    Imaging Ct Abdomen Pelvis Wo Contrast  Result Date: 09/05/2017 CLINICAL DATA:  Nausea and  vomiting with abdominal distention. Elevated creatinine. EXAM: CT ABDOMEN AND PELVIS WITHOUT CONTRAST TECHNIQUE: Multidetector CT imaging of the abdomen and pelvis  was performed following the standard protocol without IV contrast. COMPARISON:  None. FINDINGS: Lower chest: Atelectasis in both lung bases. 5.5 mm nodule in the anterior right middle lung. Hepatobiliary: No focal liver abnormality is seen. No gallstones, gallbladder wall thickening, or biliary dilatation. Pancreas: Unremarkable. No pancreatic ductal dilatation or surrounding inflammatory changes. Spleen: Normal in size without focal abnormality. Adrenals/Urinary Tract: Adrenal glands are unremarkable. Kidneys are normal, without renal calculi, focal lesion, or hydronephrosis. Bladder is unremarkable. Stomach/Bowel: Stomach, small bowel, and colon are mostly decompressed. No inflammatory changes although evaluation of the bowel wall is limited due to under distention. Appendix is normal. Vascular/Lymphatic: Aortic atherosclerosis. No enlarged abdominal or pelvic lymph nodes. Flattening of the inferior vena cava suggest hypovolemia. Reproductive: Uterus and bilateral adnexa are unremarkable. Other: No abdominal wall hernia or abnormality. No abdominopelvic ascites. Musculoskeletal: Schmorl's nodes at L2 and L3. Degenerative changes in the lumbar spine. Degenerative changes in the left hip. Postoperative right hip arthroplasty. No destructive bone lesions. IMPRESSION: 1. No acute process demonstrated in the abdomen or pelvis. No evidence of bowel obstruction or inflammation. 2. 5.5 mm nodule in the right middle lung. No follow-up needed if patient is low-risk. Non-contrast chest CT can be considered in 12 months if patient is high-risk. This recommendation follows the consensus statement: Guidelines for Management of Incidental Pulmonary Nodules Detected on CT Images: From the Fleischner Society 2017; Radiology 2017; 284:228-243. 3. Aortic atherosclerosis.  4. Flattened IVC suggest hypovolemia. Electronically Signed   By: Burman Nieves M.D.   On: 09/05/2017 04:20   Ct Head Wo Contrast  Result Date: 09/04/2017 CLINICAL DATA:  47 year old female with acute headache following fall yesterday. Initial encounter. EXAM: CT HEAD WITHOUT CONTRAST TECHNIQUE: Contiguous axial images were obtained from the base of the skull through the vertex without intravenous contrast. COMPARISON:  06/30/2016 temporal bone CT FINDINGS: Brain: No evidence of acute infarction, hemorrhage, hydrocephalus, extra-axial collection or mass lesion/mass effect. Vascular: No hyperdense vessel or unexpected calcification. Skull: No acute abnormality. Sinuses/Orbits: No acute abnormality. LEFT temporal/mastoid surgical changes again noted with LEFT mastoid effusion. Other: None IMPRESSION: 1. No evidence of acute intracranial abnormality Electronically Signed   By: Harmon Pier M.D.   On: 09/04/2017 11:45   Dg Chest Portable 1 View  Result Date: 09/05/2017 CLINICAL DATA:  Coffee-ground emesis. Sepsis. EXAM: PORTABLE CHEST 1 VIEW COMPARISON:  09/15/2013 FINDINGS: Shallow inspiration. Heart size and pulmonary vascularity are normal. No airspace disease or consolidation in the lungs. No blunting of costophrenic angles. No pneumothorax. Mediastinal contours appear intact. IMPRESSION: No active disease. Electronically Signed   By: Burman Nieves M.D.   On: 09/05/2017 02:37   Dg Abd Portable 2 Views  Result Date: 09/05/2017 CLINICAL DATA:  Coffee ground emesis. EXAM: PORTABLE ABDOMEN - 2 VIEW COMPARISON:  None. FINDINGS: Examination is technically limited due to under penetration. As visualized, there does not appear to be any significant large or small bowel distention although gas-filled bowel loops are present. No radiopaque stones are identified. No free intra-abdominal air. Postoperative changes in the right hip. IMPRESSION: Technically limited study. No gross evidence of bowel obstruction.  Electronically Signed   By: Burman Nieves M.D.   On: 09/05/2017 02:38    CULTURES: Results for orders placed or performed during the hospital encounter of 09/05/17  Blood culture (routine x 2)     Status: None (Preliminary result)   Collection Time: 09/05/17  2:27 AM  Result Value Ref Range Status   Specimen Description LEFT ANTECUBITAL  Final  Special Requests   Final    BOTTLES DRAWN AEROBIC AND ANAEROBIC Blood Culture adequate volume Performed at [facility], [address] [place], [place] [address]    Culture PENDING  Incomplete   Report Status PENDING  Incomplete  Blood culture (routine x 2)     Status: None (Preliminary result)   Collection Time: 09/05/17  2:36 AM  Result Value Ref Range Status   Specimen Description BLOOD LEFT HAND  Final   Special Requests   Final    BOTTLES DRAWN AEROBIC ONLY Blood Culture results may not be optimal due to an inadequate volume of blood received in culture bottles Performed at [facility], [address] [place], [place] [address]    Culture PENDING  Incomplete   Report Status PENDING  Incomplete     ASSESSMENT / PLAN: Principal Problem:   Hyponatremia Active Problems:   Cardiac enzymes elevated   Hypokalemia   Hypomagnesemia   Malnutrition (HCC)   Acquired hypothyroidism   Pulmonary nodule   By systems:  CARDIOVASCULAR  Elevated troponins  Hypotension Check BNP, lactate. Titrate phenylephrine for MAP 65. 2D echo 12-lead EKG  RENAL/METABOLIC  Hypovolemic hyponatremia, severe (Na 111 initially), not on thiazide  Hypokalemia  Hypomagnesemia Nephrology consultation is advised. Check TSH, cortisol, urine Na, urine Cl, osmolalities (serum, urine). Room air ABG to assess acid-base balance. It is absolutely critical to avoid overly rapid correction of Na in this patient (in addition to avoiding worsening hyponatremia). We will request PICC line insertion in anticipation of needing 3% hypertonic saline for  correction of serum sodium.  Also, I believe clinical nutrition consult is warranted.  Long-term TPN may need to be considered.     INFECTIOUS  Leukocytosis of unknown clinical significance Check procalcitonin   ENDOCRINE  Hypothyroid Check TSH. Continue Synthroid.   NEUROLOGIC  Dementia, probably multifactorial Check B12 and folate. MVI  GASTROINTESTINAL  History of vitamin ADEK deficiencies  Malnutrition  Emesis Check prealbumin. Gastroccult.  Hemoccult. GI prophylaxis: famotidine   PULMONARY  Pulmonary nodule, right middle lobe Curiously, the presence of this nodules raises the question of possible SIADH. Chest CT with IV contrast    HEMATOLOGIC  No acute issues DVT prophylaxis: SCDs.    Admit to ICU under my service (Attending: Marcelle SmilingSeong-Joo Arda Keadle, MD) with the diagnoses highlighted above in the active Hospital Problem List (ASSESSMENT).   FAMILY  - Updates: Sister updated  - Inter-disciplinary family meet or Palliative Care meeting due by:/24/2019  Critical care time: 90 minutes.  The treatment and management of the patient's condition was required based on the threat of imminent deterioration. This time reflects time spent by the physician evaluating, providing care and managing the critically ill patient's care. The time was spent at the immediate bedside (or on the same floor/unit and dedicated to this patient's care). Time involved in separately billable procedures is NOT included int he critical care time indicated above. Family meeting and update time may be included above if and only if the patient is unable/incompetent to participate in clinical interview and/or decision making, and the discussion was necessary to determining treatment decisions.  Marcelle SmilingSeong-Joo Jarreau Callanan, MD Board Certified by the ABIM, Pulmonary Diseases & Critical Care Medicine  Marcelle SmilingSeong-Joo Rilen Shukla, MD Board Certified by the ABIM, Pulmonary Diseases & Critical Care Medicine  [facility] Pulmonary/Critical Care Critical Care Pager: [phone]  09/05/2017, 6:04 AM

## 2017-09-05 NOTE — ED Notes (Addendum)
Urine culture has not yet been completed. Error in charting.

## 2017-09-05 NOTE — Progress Notes (Signed)
Spoke with Junius Roadsroyce RN re PICC order.  Blood cultures drawn this date with  blood pressures low throughout the night.  Troyce RN to speak with physician re  IV access.

## 2017-09-05 NOTE — Progress Notes (Signed)
  Echocardiogram 2D Echocardiogram has been performed.  Bailey SkeenVijay  Debby Alexander 09/05/2017, 9:22 AM

## 2017-09-05 NOTE — Consult Note (Addendum)
Reason for Consult:hyponatremia Referring Physician: Wylodean Shimmel is an 47 y.o. female.  HPI: patient transferred from outside hospital where she was seen for a fall and found to have severe hyponatremia and hypokalemia.  She has dementia at baseline.    She remains oriented to self only, she denies any pain or headaches, she endorses dizziness and chills but denies dysuria.     Past Medical History:  Diagnosis Date  . Bulimia   . Dementia 10/2015  . Thyroid disease     Past Surgical History:  Procedure Laterality Date  . arm surgery Right   . 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 Arthritis Mother   . COPD Mother   . Cancer Mother        Veatrice Bourbon Cancer  . Depression Mother        Bipolar  . Diabetes Mother   . Hearing loss Mother   . Heart disease Mother   . Hyperlipidemia Mother   . Hypertension Mother   . Kidney disease Mother   . Miscarriages / [place] Mother   . Arthritis Father   . Asthma Father   . Cancer Father        prostate  . Arthritis Sister   . Breast cancer Sister 26  . Stroke Maternal Grandmother   . Arthritis Paternal Grandmother   . Heart disease Paternal Grandmother   . Heart disease Paternal Grandfather   . Miscarriages / [place] Sister     Social History:  reports that she quit smoking about 2 years ago. Her smoking use included cigarettes. She has never used smokeless tobacco. She reports that she drank alcohol. She reports that she does not use drugs.  Allergies:  Allergies  Allergen Reactions  . Aspirin Other (See Comments)    Extreme drowsiness    Medications: I have reviewed the patient's current medications.   Results for orders placed or performed during the hospital encounter of 09/05/17 (from the past 48 hour(s))  Comprehensive metabolic panel     Status: Abnormal   Collection Time: 09/05/17 12:20 AM  Result Value Ref Range   Sodium 111  (LL) 135 - 145 mmol/L    Comment: CRITICAL RESULT CALLED TO, READ BACK BY AND VERIFIED WITH: PRUITT,G @ 0130 ON 09/05/17 BY JUW    Potassium 2.0 (LL) 3.5 - 5.1 mmol/L    Comment: CRITICAL RESULT CALLED TO, READ BACK BY AND VERIFIED WITH: PRUITT,G @ 0130 ON 09/05/17 BY JUW    Chloride 72 (L) 98 - 111 mmol/L   CO2 24 22 - 32 mmol/L   Glucose, Bld 109 (H) 70 - 99 mg/dL   BUN <5 (L) 6 - 20 mg/dL   Creatinine, Ser 0.48 0.44 - 1.00 mg/dL   Calcium 7.9 (L) 8.9 - 10.3 mg/dL   Total Protein 6.1 (L) 6.5 - 8.1 g/dL   Albumin 3.4 (L) 3.5 - 5.0 g/dL   AST 71 (H) 15 - 41 U/L   ALT 37 0 - 44 U/L   Alkaline Phosphatase 80 38 - 126 U/L   Total Bilirubin 1.8 (H) 0.3 - 1.2 mg/dL   GFR calc non Af Amer >60 >60 mL/min   GFR calc Af Amer >60 >60 mL/min    Comment: (NOTE) The eGFR has been calculated using the CKD EPI equation. This calculation has not been validated in all clinical situations. eGFR's persistently <60 mL/min signify possible Chronic  Kidney Disease.    Anion gap 15 5 - 15    Comment: Performed at [facility], [address], [place], [place] [address]  CBC     Status: Abnormal   Collection Time: 09/05/17 12:20 AM  Result Value Ref Range   WBC 21.1 (H) 4.0 - 10.5 K/uL   RBC 5.26 (H) 3.87 - 5.11 MIL/uL   Hemoglobin 15.5 (H) 12.0 - 15.0 g/dL   HCT 42.0 36.0 - 46.0 %   MCV 79.8 78.0 - 100.0 fL   MCH 29.5 26.0 - 34.0 pg   MCHC 36.9 (H) 30.0 - 36.0 g/dL   RDW 14.0 11.5 - 15.5 %   Platelets 345 150 - 400 K/uL    Comment: Performed at [facility], [address], [place], [place] [address]  Protime-INR     Status: None   Collection Time: 09/05/17 12:35 AM  Result Value Ref Range   Prothrombin Time 13.2 11.4 - 15.2 seconds   INR 1.01     Comment: Performed at [facility], [address] [place], [place] [address]  APTT     Status: None   Collection Time: 09/05/17 12:35 AM  Result Value Ref Range   aPTT 27 24 - 36 seconds    Comment: Performed at [facility], [address],  [place], [place] [address]  Lipase, blood     Status: None   Collection Time: 09/05/17 12:35 AM  Result Value Ref Range   Lipase 21 11 - 51 U/L    Comment: Performed at [facility], [address], [place], [place] [address]  Troponin I     Status: Abnormal   Collection Time: 09/05/17 12:35 AM  Result Value Ref Range   Troponin I 1.22 (HH) <0.03 ng/mL    Comment: CRITICAL RESULT CALLED TO, READ BACK BY AND VERIFIED WITH: PRUITT,G @ 0139 ON 09/05/17 BY JUW Performed at [facility], [address], [place], [place] [address]   I-Stat beta hCG blood, ED     Status: None   Collection Time: 09/05/17 12:47 AM  Result Value Ref Range   I-stat hCG, quantitative <5.0 <5 mIU/mL   Comment 3            Comment:   GEST. AGE      CONC.  (mIU/mL)   <=1 WEEK        5 - 50     2 WEEKS       50 - 500     3 WEEKS       100 - 10,000     4 WEEKS     1,000 - 30,000        FEMALE AND NON-PREGNANT FEMALE:     LESS THAN 5 mIU/mL   Type and screen [facility]     Status: None   Collection Time: 09/05/17 12:48 AM  Result Value Ref Range   ABO/RH(D) A POS    Antibody Screen NEG    Sample Expiration      09/08/2017 Performed at [facility], [address], [place], [place] [address]   Ammonia     Status: None   Collection Time: 09/05/17 12:48 AM  Result Value Ref Range   Ammonia 11 9 - 35 umol/L    Comment: Performed at [facility], [address], [place], [place] [address]  Basic metabolic panel     Status: Abnormal   Collection Time: 09/05/17  1:38 AM  Result Value Ref Range  Sodium 111 (LL) 135 - 145 mmol/L    Comment: CRITICAL RESULT CALLED TO, READ BACK BY AND VERIFIED WITH: MOSHER,J @ 0210 ON 09/05/17 BY JUW    Potassium 1.9 (LL) 3.5 - 5.1 mmol/L    Comment: CRITICAL RESULT CALLED TO, READ BACK BY AND VERIFIED WITH: MOSHER,J @ 0210 ON 09/05/17 BY JUW    Chloride 81 (L) 98 - 111 mmol/L   CO2 21 (L) 22 - 32 mmol/L   Glucose, Bld 110 (H) 70 - 99 mg/dL   BUN <5 (L) 6 - 20 mg/dL    Creatinine, Ser 0.35 (L) 0.44 - 1.00 mg/dL   Calcium 6.7 (L) 8.9 - 10.3 mg/dL   GFR calc non Af Amer >60 >60 mL/min   GFR calc Af Amer >60 >60 mL/min    Comment: (NOTE) The eGFR has been calculated using the CKD EPI equation. This calculation has not been validated in all clinical situations. eGFR's persistently <60 mL/min signify possible Chronic Kidney Disease.    Anion gap 9 5 - 15    Comment: Performed at [facility], [address] [place], [place] [address]  Magnesium     Status: Abnormal   Collection Time: 09/05/17  1:38 AM  Result Value Ref Range   Magnesium 1.2 (L) 1.7 - 2.4 mg/dL    Comment: Performed at [facility], [address], [place], [place] [address]  Troponin I     Status: Abnormal   Collection Time: 09/05/17  1:38 AM  Result Value Ref Range   Troponin I 0.89 (HH) <0.03 ng/mL    Comment: CRITICAL RESULT CALLED TO, READ BACK BY AND VERIFIED WITH: MOSHER,J @ 0219 ON 09/05/17 BY JUW Performed at [facility], [address], [place], [place] [address]   TSH     Status: None   Collection Time: 09/05/17  1:38 AM  Result Value Ref Range   TSH 3.687 0.350 - 4.500 uIU/mL    Comment: Performed by a 3rd Generation assay with a functional sensitivity of <=0.01 uIU/mL. Performed at [facility], [address] [place], [place] [address]   I-Stat CG4 Lactic Acid, ED     Status: None   Collection Time: 09/05/17  1:45 AM  Result Value Ref Range   Lactic Acid, Venous 1.06 0.5 - 1.9 mmol/L  Blood culture (routine x 2)     Status: None (Preliminary result)   Collection Time: 09/05/17  2:27 AM  Result Value Ref Range   Specimen Description LEFT ANTECUBITAL    Special Requests      BOTTLES DRAWN AEROBIC AND ANAEROBIC Blood Culture adequate volume   Culture      NO GROWTH < 12 HOURS Performed at [facility], [address] [place], [place] [address]    Report Status PENDING   Blood culture (routine x 2)     Status: None (Preliminary result)   Collection Time: 09/05/17   2:36 AM  Result Value Ref Range   Specimen Description BLOOD LEFT HAND    Special Requests      BOTTLES DRAWN AEROBIC ONLY Blood Culture results may not be optimal due to an inadequate volume of blood received in culture bottles   Culture      NO GROWTH < 12 HOURS Performed at [facility], [address], [place], [place] [address]    Report Status PENDING   Blood gas, arterial ([facility] & AP ONLY)     Status: Abnormal   Collection Time: 09/05/17  3:00 AM  Result  Value Ref Range   FIO2 28.00    Delivery systems NASAL CANNULA    pH, Arterial 7.506 (H) 7.350 - 7.450   pCO2 arterial 27.6 (L) 32.0 - 48.0 mmHg   pO2, Arterial 97.7 83.0 - 108.0 mmHg   Bicarbonate 24.4 20.0 - 28.0 mmol/L   Acid-base deficit 1.1 0.0 - 2.0 mmol/L   O2 Saturation 97.7 %   Collection site RADIAL    Drawn by [id number]    Sample type ARTERIAL    Allens test (pass/fail) PASS PASS    Comment: Performed at [facility], [address], [place], [place] [address]  Glucose, capillary     Status: Abnormal   Collection Time: 09/05/17  5:30 AM  Result Value Ref Range   Glucose-Capillary 148 (H) 70 - 99 mg/dL  Urinalysis, Routine w reflex microscopic     Status: Abnormal   Collection Time: 09/05/17  6:23 AM  Result Value Ref Range   Color, Urine COLORLESS (A) YELLOW   APPearance CLEAR CLEAR   Specific Gravity, Urine 1.001 (L) 1.005 - 1.030   pH 7.0 5.0 - 8.0   Glucose, UA NEGATIVE NEGATIVE mg/dL   Hgb urine dipstick NEGATIVE NEGATIVE   Bilirubin Urine NEGATIVE NEGATIVE   Ketones, ur NEGATIVE NEGATIVE mg/dL   Protein, ur NEGATIVE NEGATIVE mg/dL   Nitrite NEGATIVE NEGATIVE   Leukocytes, UA NEGATIVE NEGATIVE    Comment: Performed at [facility] [address], [place], [place] [address]  Na and K (sodium & potassium), rand urine     Status: None   Collection Time: 09/05/17  6:24 AM  Result Value Ref Range   Sodium, Ur 12 mmol/L   Potassium Urine 4 mmol/L    Comment: Performed at [facility] [address] [place], [place] [address]  Osmolality, urine     Status: Abnormal   Collection Time: 09/05/17  6:25 AM  Result Value Ref Range   Osmolality, Ur 53 (L) 300 - 900 mOsm/kg    Comment: Performed at [facility] [address], [place], [place] [address]  Chloride, urine, random     Status: None   Collection Time: 09/05/17  6:25 AM  Result Value Ref Range   Chloride Urine <15 mmol/L    Comment: REPEATED TO VERIFY Performed at [facility] [address], [place], [place] [address]   Type and screen [place]     Status: None   Collection Time: 09/05/17  7:00 AM  Result Value Ref Range   ABO/RH(D) A POS    Antibody Screen NEG    Sample Expiration      09/08/2017 Performed at [facility], [place] [address], [place], [place] [address]   ABO/Rh     Status: None   Collection Time: 09/05/17  7:00 AM  Result Value Ref Range   ABO/RH(D)      A POS Performed at [facility] [address], [place], [place] [address]   Cortisol     Status: None   Collection Time: 09/05/17  7:01 AM  Result Value Ref Range   Cortisol, Plasma 14.4 ug/dL    Comment: (NOTE) AM    6.7 - 22.6 ug/dL PM   <10.0       ug/dL Performed at [facility] [address] [place], [place] [address]   Brain natriuretic peptide     Status: Abnormal   Collection Time: 09/05/17  7:01  AM  Result Value Ref Range   B Natriuretic Peptide 465.9 (H) 0.0 - 100.0 pg/mL    Comment: Performed at [facility] [address] [place], [place] [address]  Comprehensive metabolic panel     Status: Abnormal   Collection Time: 09/05/17  7:09 AM  Result Value Ref Range   Sodium 124 (L) 135 - 145 mmol/L   Potassium 3.4 (L) 3.5 - 5.1 mmol/L   Chloride 90 (L) 98 - 111 mmol/L   CO2 19 (L) 22 - 32 mmol/L   Glucose, Bld 143 (H) 70 - 99 mg/dL   BUN <5 (L) 6 - 20 mg/dL   Creatinine, Ser 0.44 0.44 - 1.00 mg/dL   Calcium 6.7 (L) 8.9 - 10.3 mg/dL   Total Protein 4.7 (L) 6.5 -  8.1 g/dL   Albumin 2.6 (L) 3.5 - 5.0 g/dL   AST 54 (H) 15 - 41 U/L   ALT 24 0 - 44 U/L   Alkaline Phosphatase 70 38 - 126 U/L   Total Bilirubin 2.0 (H) 0.3 - 1.2 mg/dL   GFR calc non Af Amer >60 >60 mL/min   GFR calc Af Amer >60 >60 mL/min    Comment: (NOTE) The eGFR has been calculated using the CKD EPI equation. This calculation has not been validated in all clinical situations. eGFR's persistently <60 mL/min signify possible Chronic Kidney Disease.    Anion gap 15 5 - 15    Comment: Performed at [facility] [address], [place], [place] [address]  Magnesium     Status: None   Collection Time: 09/05/17  7:09 AM  Result Value Ref Range   Magnesium 1.9 1.7 - 2.4 mg/dL    Comment: Performed at [facility] [address], [place], [place] [address]  Phosphorus     Status: Abnormal   Collection Time: 09/05/17  7:09 AM  Result Value Ref Range   Phosphorus 2.1 (L) 2.5 - 4.6 mg/dL    Comment: Performed at [facility] [address], [place], [place] [address]  CBC     Status: Abnormal   Collection Time: 09/05/17  7:09 AM  Result Value Ref Range   WBC 9.6 4.0 - 10.5 K/uL   RBC 3.67 (L) 3.87 - 5.11 MIL/uL   Hemoglobin 10.7 (L) 12.0 - 15.0 g/dL   HCT 30.7 (L) 36.0 - 46.0 %   MCV 83.7 78.0 - 100.0 fL   MCH 29.2 26.0 - 34.0 pg   MCHC 34.9 30.0 - 36.0 g/dL   RDW 14.0 11.5 - 15.5 %   Platelets 276 150 - 400 K/uL    Comment: Performed at [facility], [place] [address], [place], [place] [address]  Procalcitonin - Baseline     Status: None   Collection Time: 09/05/17  7:09 AM  Result Value Ref Range   Procalcitonin 12.39 ng/mL    Comment:        Interpretation: PCT >= 10 ng/mL: Important systemic inflammatory response, almost exclusively due to severe bacterial sepsis or septic shock. (NOTE)       Sepsis PCT Algorithm           Lower Respiratory Tract                                      Infection PCT Algorithm    ----------------------------      ----------------------------  PCT < 0.25 ng/mL                PCT < 0.10 ng/mL         Strongly encourage             Strongly discourage   discontinuation of antibiotics    initiation of antibiotics    ----------------------------     -----------------------------       PCT 0.25 - 0.50 ng/mL            PCT 0.10 - 0.25 ng/mL               OR       >80% decrease in PCT            Discourage initiation of                                            antibiotics      Encourage discontinuation           of antibiotics    ----------------------------     -----------------------------         PCT >= 0.50 ng/mL              PCT 0.26 - 0.50 ng/mL                AND       <80% decrease in PCT             Encourage initiation of                                             antibiotics       Encourage continuation           of antibiotics    ----------------------------     -----------------------------        PCT >= 0.50 ng/mL                  PCT > 0.50 ng/mL               AND         increase in PCT                  Strongly encourage                                      initiation of antibiotics    Strongly encourage escalation           of antibiotics                                     -----------------------------                                           PCT <= 0.25 ng/mL  OR                                        > 80% decrease in PCT                                     Discontinue / Do not initiate                                             antibiotics Performed at [facility], [place] [address] [place], [place] [address]   Troponin I (q 6hr x 3)     Status: Abnormal   Collection Time: 09/05/17  7:09 AM  Result Value Ref Range   Troponin I 0.64 (HH) <0.03 ng/mL    Comment: CRITICAL RESULT CALLED TO, READ BACK BY AND VERIFIED WITH: T.HOOD RN @ [id number] 09/05/17 BY C.EDENS Performed at [facility], [place] [address]  [place], [place] [address]   Vitamin B12     Status: Abnormal   Collection Time: 09/05/17  7:09 AM  Result Value Ref Range   Vitamin B-12 1,188 (H) 180 - 914 pg/mL    Comment: (NOTE) This assay is not validated for testing neonatal or myeloproliferative syndrome specimens for Vitamin B12 levels. Performed at [facility], [place] [address] [place], [place] [address]   I-STAT, [place] 8     Status: Abnormal   Collection Time: 09/05/17  7:10 AM  Result Value Ref Range   Sodium 118 (LL) 135 - 145 mmol/L   Potassium 2.7 (LL) 3.5 - 5.1 mmol/L   Chloride 84 (L) 98 - 111 mmol/L   BUN <3 (L) 6 - 20 mg/dL   Creatinine, Ser 0.30 (L) 0.44 - 1.00 mg/dL   Glucose, Bld 160 (H) 70 - 99 mg/dL   Calcium, Ion 0.83 (LL) 1.15 - 1.40 mmol/L   TCO2 21 (L) 22 - 32 mmol/L   Hemoglobin 15.3 (H) 12.0 - 15.0 g/dL   HCT 45.0 36.0 - 46.0 %   Comment NOTIFIED PHYSICIAN   Folate     Status: None   Collection Time: 09/05/17  7:12 AM  Result Value Ref Range   Folate 8.9 >5.9 ng/mL    Comment: Performed at [facility], [place] [address], [place], [place] [address]  Glucose, capillary     Status: Abnormal   Collection Time: 09/05/17  8:19 AM  Result Value Ref Range   Glucose-Capillary 157 (H) 70 - 99 mg/dL    Ct Abdomen Pelvis Wo Contrast  Result Date: 09/05/2017 CLINICAL DATA:  Nausea and vomiting with abdominal distention. Elevated creatinine. EXAM: CT ABDOMEN AND PELVIS WITHOUT CONTRAST TECHNIQUE: Multidetector CT imaging of the abdomen and pelvis was performed following the standard protocol without IV contrast. COMPARISON:  None. FINDINGS: Lower chest: Atelectasis in both lung bases. 5.5 mm nodule in the anterior right middle lung. Hepatobiliary: No focal liver abnormality is seen. No gallstones, gallbladder wall thickening, or biliary dilatation. Pancreas: Unremarkable. No pancreatic ductal dilatation or surrounding inflammatory changes. Spleen: Normal in size without focal abnormality. Adrenals/Urinary  Tract: Adrenal glands are unremarkable. Kidneys are normal, without renal calculi, focal lesion, or hydronephrosis. Bladder is unremarkable. Stomach/Bowel: Stomach,  small bowel, and colon are mostly decompressed. No inflammatory changes although evaluation of the bowel wall is limited due to under distention. Appendix is normal. Vascular/Lymphatic: Aortic atherosclerosis. No enlarged abdominal or pelvic lymph nodes. Flattening of the inferior vena cava suggest hypovolemia. Reproductive: Uterus and bilateral adnexa are unremarkable. Other: No abdominal wall hernia or abnormality. No abdominopelvic ascites. Musculoskeletal: Schmorl's nodes at L2 and L3. Degenerative changes in the lumbar spine. Degenerative changes in the left hip. Postoperative right hip arthroplasty. No destructive bone lesions. IMPRESSION: 1. No acute process demonstrated in the abdomen or pelvis. No evidence of bowel obstruction or inflammation. 2. 5.5 mm nodule in the right middle lung. No follow-up needed if patient is low-risk. Non-contrast chest CT can be considered in 12 months if patient is high-risk. This recommendation follows the consensus statement: Guidelines for Management of Incidental Pulmonary Nodules Detected on CT Images: From the Fleischner Society 2017; Radiology 2017; 284:228-243. 3. Aortic atherosclerosis. 4. Flattened IVC suggest hypovolemia. Electronically Signed   By: Lucienne Capers M.D.   On: 09/05/2017 04:20   Ct Head Wo Contrast  Result Date: 09/04/2017 CLINICAL DATA:  47 year old female with acute headache following fall yesterday. Initial encounter. EXAM: CT HEAD WITHOUT CONTRAST TECHNIQUE: Contiguous axial images were obtained from the base of the skull through the vertex without intravenous contrast. COMPARISON:  06/30/2016 temporal bone CT FINDINGS: Brain: No evidence of acute infarction, hemorrhage, hydrocephalus, extra-axial collection or mass lesion/mass effect. Vascular: No hyperdense vessel or unexpected  calcification. Skull: No acute abnormality. Sinuses/Orbits: No acute abnormality. LEFT temporal/mastoid surgical changes again noted with LEFT mastoid effusion. Other: None IMPRESSION: 1. No evidence of acute intracranial abnormality Electronically Signed   By: Margarette Canada M.D.   On: 09/04/2017 11:45   Dg Chest Portable 1 View  Result Date: 09/05/2017 CLINICAL DATA:  Coffee-ground emesis. Sepsis. EXAM: PORTABLE CHEST 1 VIEW COMPARISON:  09/15/2013 FINDINGS: Shallow inspiration. Heart size and pulmonary vascularity are normal. No airspace disease or consolidation in the lungs. No blunting of costophrenic angles. No pneumothorax. Mediastinal contours appear intact. IMPRESSION: No active disease. Electronically Signed   By: Lucienne Capers M.D.   On: 09/05/2017 02:37   Dg Abd Portable 2 Views  Result Date: 09/05/2017 CLINICAL DATA:  Coffee ground emesis. EXAM: PORTABLE ABDOMEN - 2 VIEW COMPARISON:  None. FINDINGS: Examination is technically limited due to under penetration. As visualized, there does not appear to be any significant large or small bowel distention although gas-filled bowel loops are present. No radiopaque stones are identified. No free intra-abdominal air. Postoperative changes in the right hip. IMPRESSION: Technically limited study. No gross evidence of bowel obstruction. Electronically Signed   By: Lucienne Capers M.D.   On: 09/05/2017 02:38    Review of Systems  Constitutional: Positive for chills.  Cardiovascular: Negative for chest pain.  Gastrointestinal: Negative for abdominal pain.  Genitourinary: Negative for dysuria.   Blood pressure (!) 80/53, pulse 82, temperature 98.3 F (36.8 C), temperature source Oral, resp. rate (!) 22, height _0  (1.626 m), weight 71.1 kg, SpO2 100 %.  Cardiac: normal rate and rhythm, clear s1 and s2 Pulmonary: decreased lung sounds in bases otherwise clear, not in distress Abdominal: non distended abdomen, soft and non tender, increased bowel  sounds Extremities: no LE  edema Mental status: Alert but oriented to self only  Assessment/Plan:   Hypovolemic Hyponatremia: This is likely a sequale of poor oral intake of osms, pt also reportedly drinks large amounts of water.  Diarrhea could fit but  less likely given whole clinical picture.Compulsive water drinker and is vol deplete. Kidneys reacting appropriately to hypoOSM, and low SNA, and low bps.    -currently approaching overcorrection will continue to hold NS and monitor serial bmp STAT BMP ordered if consistently over 124 will need to add water +/- ddavp -continue fluid restriction -pt will require slow repletion of volume with NS fluid with K during hospitilization DO NOT OVER  CORRECT RAPIDLY.  Hypokalemia: likely also due to poor oral intake This has been a chronic situation for her and kidneys have adapted with slowly lowering U k, and is very low .  This is not c/w Mg depletion.   -already repleted with oral K to 3.4 -continue to monitor and replete  Hypocalcemia: corrected Ca 7.8 Due to low albumen  -will replete Ca IV 1g after next bmp if Na appropriate to do so  Prolonged QT: mag and K repleting nicely  -will replete calcium after next bmp  Hypophosphatemia: mildly low will replete before pt gets diet  -formulations have na and K so will wait and make sure to replete when pt normalizes Malnutrition  Hypothyroidism: TSH wnl   Troponin elevation: likely demand-management per primary  -management per primary Malnutrition  Malnutrition: per primary correct when lytes stabilize and pt can eat  Vickki Muff 09/05/2017, 9:35 AM  I have seen and examined this patient and agree with the plan of care seen, eval, examiend, discussed with resident, changes being made. Jeneen Rinks Noraa Pickeral 09/05/2017, 4:01 PM

## 2017-09-05 NOTE — ED Notes (Signed)
CRITICAL VALUE ALERT  Critical Value: Potassium 2.0  Date & Time Notied:  09/05/17 @ 0130 Provider Notified: Rancour Orders Received/Actions taken: Redraw and Recollect

## 2017-09-05 NOTE — Progress Notes (Signed)
Pharmacy Antibiotic Note  Bailey Alexander is a 47 y.o. female admitted on 09/05/2017 with AMS and hyponatemia, leukocytosis and possible sepsis  Pharmacy has been consulted for Vancomycin and Zosyn  Dosing.  Vancomycin 1 g IV given in ED at 0230  Plan: Vancomycin 1 g IV q12h Zosyn 3.375 g IV q8h   Height: 5\' 4"  (162.6 cm) Weight: 156 lb 12 oz (71.1 kg) IBW/kg (Calculated) : 54.7  Temp (24hrs), Avg:97.7 F (36.5 C), Min:97.3 F (36.3 C), Max:98 F (36.7 C)  Recent Labs  Lab 09/05/17 0020 09/05/17 0138 09/05/17 0145 09/05/17 0710  WBC 21.1*  --   --   --   CREATININE 0.48 0.35*  --  0.30*  LATICACIDVEN  --   --  1.06  --     Estimated Creatinine Clearance: 85.9 mL/min (A) (by C-G formula based on SCr of 0.3 mg/dL (L)).    Allergies  Allergen Reactions  . Aspirin Other (See Comments)    Extreme drowsiness   Eddie Candlebbott, Arjay Jaskiewicz [place] 09/05/2017 7:33 AM

## 2017-09-05 NOTE — Progress Notes (Signed)
RT was unable to obtain ABG from patient. The results were venous. Pulmonology paged to notify.

## 2017-09-05 NOTE — Progress Notes (Signed)
RT attempted to obtain an ABG from the patient x2. RT unsuccessful and waiting for another RT to arrive.

## 2017-09-05 NOTE — ED Triage Notes (Signed)
[facility] with coffee ground emesis x 3.

## 2017-09-05 NOTE — Progress Notes (Addendum)
Serum Na returned at 129 Started pt on D5W 100cc/hr to treat overcorrection.  Shoot for Na of no greater than 124 in first 24 hrs.  K also rising rapidly likely already above 5 given lag time in lab processing.  Stopped potassium supplementation and diet temporarily.   Will repeat ECG to look for any associated abnormalities and see if QT interval has improved.

## 2017-09-05 NOTE — ED Provider Notes (Signed)
[facility] EMERGENCY DEPARTMENT Provider Note   CSN: [id number] Arrival date & time: 09/05/17  0015     History   Chief Complaint Chief Complaint  Patient presents with  . Emesis    HPI Bailey Alexander is a 47 y.o. female.  Level 5 caveat for dementia.  Patient brought from living facility with reported coffee-ground emesis x3.  This was not witnessed.  Patient reports nausea and abdominal pain and dark stools.  She does not take any blood thinners.  History of alcohol abuse in the past but none recently.  Patient seen in the ED earlier today for a fall with bruising all over and an APS report was filed.  She does not have any history of bleeding disorder.  Patient does not know if she has any history of ulcers or varices.  Unable to state when her last drink was.  The history is provided by the patient and the EMS personnel. The history is limited by the condition of the patient.  Emesis      Past Medical History:  Diagnosis Date  . Bulimia   . Dementia 10/2015  . Thyroid disease     Patient Active Problem List   Diagnosis Date Noted  . Debility 02/11/2016  . Protein-calorie malnutrition, severe (HCC) 01/31/2016  . Anorexia nervosa 01/31/2016  . Acquired hypothyroidism 01/31/2016  . Dementia 10/20/2015    Past Surgical History:  Procedure Laterality Date  . arm surgery Right   . 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 Authorizing Provider  ARIPiprazole (ABILIFY) 10 MG tablet Take 1.5 tablets (15 mg total) by mouth daily. 02/11/16   Mechele Claude, MD  hydrOXYzine (ATARAX/VISTARIL) 25 MG tablet Take 25 mg by mouth 3 (three) times daily.    [provider]  levothyroxine (SYNTHROID, LEVOTHROID) 50 MCG tablet Take 1 tablet (50 mcg total) by mouth daily before breakfast. 02/02/16   Mechele Claude, MD  magnesium  oxide (MAG-OX) 400 MG tablet Take 400 mg by mouth 2 (two) times daily. 07/31/15   [provider]  megestrol (MEGACE) 400 MG/10ML suspension Take 10 mLs (400 mg total) by mouth 2 (two) times daily. 02/11/16   Mechele Claude, MD  Multiple Vitamin (THERA) TABS Take by mouth.    [provider]  ondansetron (ZOFRAN ODT) 4 MG disintegrating tablet 4mg  ODT q4 hours prn nausea/vomit 09/15/13   Blane Ohara, MD  traZODone (DESYREL) 150 MG tablet Take 1 tablet (150 mg total) by mouth at bedtime. For sleep 02/11/16   Mechele Claude, MD  vitamin B-12 (CYANOCOBALAMIN) 1000 MCG tablet Take 1,000 mcg by mouth daily.    [provider]  Vitamin D, Ergocalciferol, (DRISDOL) 50000 units CAPS capsule Take 1 capsule (50,000 Units total) by mouth 2 (two) times a week. 02/03/16   Mechele Claude, MD    Family History Family History  Problem Relation Age of Onset  . Arthritis Mother   . COPD Mother   . Cancer Mother        Roda Shutters Cancer  . Depression Mother        Bipolar  . Diabetes Mother   . Hearing loss Mother   . Heart disease Mother   . Hyperlipidemia Mother   . Hypertension Mother   . Kidney disease Mother   . Miscarriages /  Stillbirths Mother   . Arthritis Father   . Asthma Father   . Cancer Father        prostate  . Arthritis Sister   . Breast cancer Sister 63  . Stroke Maternal Grandmother   . Arthritis Paternal Grandmother   . Heart disease Paternal Grandmother   . Heart disease Paternal Grandfather   . Miscarriages / [place] Sister     Social History Social History   Tobacco Use  . Smoking status: Former Smoker    Types: Cigarettes    Last attempt to quit: 07/31/2015    Years since quitting: 2.1  . Smokeless tobacco: Never Used  Substance Use Topics  . Alcohol use: Not Currently  . Drug use: No     Allergies   Aspirin   Review of Systems Review of Systems  Unable to perform ROS: Dementia  Gastrointestinal: Positive for vomiting.      Physical Exam Updated Vital Signs BP 97/72 (BP Location: Left Arm)   Pulse 89   Temp 97.7 F (36.5 C) (Oral)   Resp 16   Ht 5\' 4"  (1.626 m)   Wt 68 kg   LMP  (LMP Unknown)   SpO2 100%   BMI 25.75 kg/m   Physical Exam  Constitutional: She is oriented to person, place, and time. She appears well-developed and well-nourished. No distress.  Chronically ill-appearing  HENT:  Head: Normocephalic and atraumatic.  Mouth/Throat: Oropharynx is clear and moist. No oropharyngeal exudate.  Eyes: Pupils are equal, round, and reactive to light. Conjunctivae and EOM are normal.  Neck: Normal range of motion. Neck supple.  No meningismus.  Cardiovascular: Normal rate, regular rhythm, normal heart sounds and intact distal pulses.  No murmur heard. Pulmonary/Chest: Effort normal and breath sounds normal. No respiratory distress. She exhibits no tenderness.  Abdominal: Soft. She exhibits distension. There is tenderness. There is no rebound and no guarding.  Mild distention with diffuse tenderness  Genitourinary:  Genitourinary Comments: Brown stool, Hemoccult negative  Musculoskeletal: Normal range of motion. She exhibits no edema or tenderness.  Neurological: She is alert and oriented to person, place, and time. No cranial nerve deficit. She exhibits normal muscle tone. Coordination normal.   5/5 strength throughout. CN 2-12 intact.Equal grip strength.   Skin: Skin is warm. Capillary refill takes less than 2 seconds. No rash noted.  Psychiatric: She has a normal mood and affect. Her behavior is normal.  Scattered areas of bruising in various stages of healing to chest, left ribs, bilateral arms  Nursing note and vitals reviewed.    ED Treatments / Results  Labs (all labs ordered are listed, but only abnormal results are displayed) Labs Reviewed  COMPREHENSIVE METABOLIC PANEL - Abnormal; Notable for the following components:      Result Value   Sodium 111 (*)    Potassium 2.0 (*)     Chloride 72 (*)    Glucose, Bld 109 (*)    BUN <5 (*)    Calcium 7.9 (*)    Total Protein 6.1 (*)    Albumin 3.4 (*)    AST 71 (*)    Total Bilirubin 1.8 (*)    All other components within normal limits  CBC - Abnormal; Notable for the following components:   WBC 21.1 (*)    RBC 5.26 (*)    Hemoglobin 15.5 (*)    MCHC 36.9 (*)    All other components within normal limits  TROPONIN I - Abnormal; Notable for the following  components:   Troponin I 1.22 (*)    All other components within normal limits  BASIC METABOLIC PANEL - Abnormal; Notable for the following components:   Sodium 111 (*)    Potassium 1.9 (*)    Chloride 81 (*)    CO2 21 (*)    Glucose, Bld 110 (*)    BUN <5 (*)    Creatinine, Ser 0.35 (*)    Calcium 6.7 (*)    All other components within normal limits  MAGNESIUM - Abnormal; Notable for the following components:   Magnesium 1.2 (*)    All other components within normal limits  TROPONIN I - Abnormal; Notable for the following components:   Troponin I 0.89 (*)    All other components within normal limits  BLOOD GAS, ARTERIAL - Abnormal; Notable for the following components:   pH, Arterial 7.506 (*)    pCO2 arterial 27.6 (*)    All other components within normal limits  OSMOLALITY, URINE - Abnormal; Notable for the following components:   Osmolality, Ur 53 (*)    All other components within normal limits  URINALYSIS, ROUTINE W REFLEX MICROSCOPIC - Abnormal; Notable for the following components:   Color, Urine COLORLESS (*)    Specific Gravity, Urine 1.001 (*)    All other components within normal limits  CBC - Abnormal; Notable for the following components:   RBC 3.67 (*)    Hemoglobin 10.7 (*)    HCT 30.7 (*)    All other components within normal limits  GLUCOSE, CAPILLARY - Abnormal; Notable for the following components:   Glucose-Capillary 148 (*)    All other components within normal limits  POCT I-STAT, CHEM 8 - Abnormal; Notable for the  following components:   Sodium 118 (*)    Potassium 2.7 (*)    Chloride 84 (*)    BUN <3 (*)    Creatinine, Ser 0.30 (*)    Glucose, Bld 160 (*)    Calcium, Ion 0.83 (*)    TCO2 21 (*)    Hemoglobin 15.3 (*)    All other components within normal limits  CULTURE, BLOOD (ROUTINE X 2)  CULTURE, BLOOD (ROUTINE X 2)  PROTIME-INR  AMMONIA  APTT  LIPASE, BLOOD  TSH  NA AND K (SODIUM & POTASSIUM), RAND UR  CHLORIDE, URINE, RANDOM  COMPREHENSIVE METABOLIC PANEL  MAGNESIUM  PHOSPHORUS  HIV ANTIBODY (ROUTINE TESTING)  PROCALCITONIN  TROPONIN I  TROPONIN I  TROPONIN I  CORTISOL  OSMOLALITY  BRAIN NATRIURETIC PEPTIDE  BASIC METABOLIC PANEL  BASIC METABOLIC PANEL  BASIC METABOLIC PANEL  BASIC METABOLIC PANEL  FOLATE  VITAMIN B12  SODIUM, URINE, RANDOM  OSMOLALITY, URINE  PREALBUMIN  BLOOD GAS, ARTERIAL  POC OCCULT BLOOD, ED  I-STAT BETA HCG BLOOD, ED ([facility], [facility], [facility] ONLY)  POC OCCULT BLOOD, ED  I-STAT CG4 LACTIC ACID, ED  I-STAT CG4 LACTIC ACID, ED  TYPE AND SCREEN  TYPE AND SCREEN    EKG EKG Interpretation  Date/Time:  Sunday September 05 2017 00:36:32 EDT Ventricular Rate:  107 PR Interval:    QRS Duration: 98 QT Interval:  370 QTC Calculation: 494 R Axis:   52 Text Interpretation:  Sinus tachycardia Biatrial enlargement Anterior infarct, old Artifact anterior Q waves Confirmed by Glynn Octaveancour, Marselino Slayton [phone]([id number]) on 09/05/2017 12:45:55 AM   Radiology Ct Abdomen Pelvis Wo Contrast  Result Date: 09/05/2017 CLINICAL DATA:  Nausea and vomiting with abdominal distention. Elevated creatinine. EXAM: CT ABDOMEN AND PELVIS WITHOUT CONTRAST TECHNIQUE: Multidetector  CT imaging of the abdomen and pelvis was performed following the standard protocol without IV contrast. COMPARISON:  None. FINDINGS: Lower chest: Atelectasis in both lung bases. 5.5 mm nodule in the anterior right middle lung. Hepatobiliary: No focal liver abnormality is seen. No gallstones, gallbladder wall thickening, or  biliary dilatation. Pancreas: Unremarkable. No pancreatic ductal dilatation or surrounding inflammatory changes. Spleen: Normal in size without focal abnormality. Adrenals/Urinary Tract: Adrenal glands are unremarkable. Kidneys are normal, without renal calculi, focal lesion, or hydronephrosis. Bladder is unremarkable. Stomach/Bowel: Stomach, small bowel, and colon are mostly decompressed. No inflammatory changes although evaluation of the bowel wall is limited due to under distention. Appendix is normal. Vascular/Lymphatic: Aortic atherosclerosis. No enlarged abdominal or pelvic lymph nodes. Flattening of the inferior vena cava suggest hypovolemia. Reproductive: Uterus and bilateral adnexa are unremarkable. Other: No abdominal wall hernia or abnormality. No abdominopelvic ascites. Musculoskeletal: Schmorl's nodes at L2 and L3. Degenerative changes in the lumbar spine. Degenerative changes in the left hip. Postoperative right hip arthroplasty. No destructive bone lesions. IMPRESSION: 1. No acute process demonstrated in the abdomen or pelvis. No evidence of bowel obstruction or inflammation. 2. 5.5 mm nodule in the right middle lung. No follow-up needed if patient is low-risk. Non-contrast chest CT can be considered in 12 months if patient is high-risk. This recommendation follows the consensus statement: Guidelines for Management of Incidental Pulmonary Nodules Detected on CT Images: From the Fleischner Society 2017; Radiology 2017; 284:228-243. 3. Aortic atherosclerosis. 4. Flattened IVC suggest hypovolemia. Electronically Signed   By: Burman Nieves M.D.   On: 09/05/2017 04:20   Ct Head Wo Contrast  Result Date: 09/04/2017 CLINICAL DATA:  48 year old female with acute headache following fall yesterday. Initial encounter. EXAM: CT HEAD WITHOUT CONTRAST TECHNIQUE: Contiguous axial images were obtained from the base of the skull through the vertex without intravenous contrast. COMPARISON:  06/30/2016 temporal  bone CT FINDINGS: Brain: No evidence of acute infarction, hemorrhage, hydrocephalus, extra-axial collection or mass lesion/mass effect. Vascular: No hyperdense vessel or unexpected calcification. Skull: No acute abnormality. Sinuses/Orbits: No acute abnormality. LEFT temporal/mastoid surgical changes again noted with LEFT mastoid effusion. Other: None IMPRESSION: 1. No evidence of acute intracranial abnormality Electronically Signed   By: Harmon Pier M.D.   On: 09/04/2017 11:45   Dg Chest Portable 1 View  Result Date: 09/05/2017 CLINICAL DATA:  Coffee-ground emesis. Sepsis. EXAM: PORTABLE CHEST 1 VIEW COMPARISON:  09/15/2013 FINDINGS: Shallow inspiration. Heart size and pulmonary vascularity are normal. No airspace disease or consolidation in the lungs. No blunting of costophrenic angles. No pneumothorax. Mediastinal contours appear intact. IMPRESSION: No active disease. Electronically Signed   By: Burman Nieves M.D.   On: 09/05/2017 02:37   Dg Abd Portable 2 Views  Result Date: 09/05/2017 CLINICAL DATA:  Coffee ground emesis. EXAM: PORTABLE ABDOMEN - 2 VIEW COMPARISON:  None. FINDINGS: Examination is technically limited due to under penetration. As visualized, there does not appear to be any significant large or small bowel distention although gas-filled bowel loops are present. No radiopaque stones are identified. No free intra-abdominal air. Postoperative changes in the right hip. IMPRESSION: Technically limited study. No gross evidence of bowel obstruction. Electronically Signed   By: Burman Nieves M.D.   On: 09/05/2017 02:38    Procedures Procedures (including critical care time)  Medications Ordered in ED Medications  sodium chloride 0.9 % bolus 1,000 mL (has no administration in time range)    And  sodium chloride 0.9 % bolus 1,000 mL (has no administration in  time range)    And  0.9 %  sodium chloride infusion (has no administration in time range)  pantoprazole (PROTONIX) 80 mg in  sodium chloride 0.9 % 100 mL IVPB (has no administration in time range)  pantoprazole (PROTONIX) 80 mg in sodium chloride 0.9 % 250 mL (0.32 mg/mL) infusion (has no administration in time range)  octreotide (SANDOSTATIN) 2 mcg/mL load via infusion 50 mcg (has no administration in time range)    And  octreotide (SANDOSTATIN) 500 mcg in sodium chloride 0.9 % 250 mL (2 mcg/mL) infusion (has no administration in time range)  ondansetron (ZOFRAN) injection 4 mg (has no administration in time range)     Initial Impression / Assessment and Plan / ED Course  I have reviewed the triage vital signs and the nursing notes.  Pertinent labs & imaging results that were available during my care of the patient were reviewed by me and considered in my medical decision making (see chart for details).     Patient from living facility with suspected coffee-ground emesis.  Seen earlier today after a fall with multiple areas of bruising. APS report was filed.  Patient started on IV fluids, IV PPI as well as octreotide given her history of alcohol abuse.  Hemoccult is negative.  Hemoglobin is 15 but leukocytosis noted.  Patient aggressively hydrated as she is hypotensive in the 80s.  Labs show sodium of 111 as well as potassium of 2.0.  Question whether this is accurate.  Troponin I 0.22.  EKG without acute ischemia.  Patient denies chest pain.  Discussed with patient's power of attorney and Sister Bailey Alexander.  Let her know of patient's critical illness.  She confirms that patient is full code.  She has not had any alcohol use for many years.  Repeat chemistry confirms severe hyponatremia and hypokalemia as well as hypomagnesemia.  Will hold further normal saline resuscitation at this time to avoid overly rapid correction of patient's sodium.  Unclear etiology of patient's presentation at this point.  Broad-spectrum antibiotic started empirically.  Afebrile.  Patient fluid resuscitated and started on PPI as  well as octreotide.  We will also start Neo-Synephrine for hypotension. CT a/p pending. No visualized rectal bleeding or hematemesis.  Chest x-ray remarkable for lung nodule without evidence of pneumonia.  Neo-Synephrine started for hypotension in the 80s though patient mental status is normal.  With her severe electrolyte abnormalities, [facility]. D/w Dr. Darrick Pennaeterding.  CRITICAL CARE Performed by: Glynn OctaveANCOUR, Lameisha Schuenemann Total critical care time: 100 minutes Critical care time was exclusive of separately billable procedures and treating other patients. Critical care was necessary to treat or prevent imminent or life-threatening deterioration. Critical care was time spent personally by me on the following activities: development of treatment plan with patient and/or surrogate as well as nursing, discussions with consultants, evaluation of patient's response to treatment, examination of patient, obtaining history from patient or surrogate, ordering and performing treatments and interventions, ordering and review of laboratory studies, ordering and review of radiographic studies, pulse oximetry and re-evaluation of patient's condition.   Final Clinical Impressions(s) / ED Diagnoses   Final diagnoses:  Hematemesis with nausea  Hyponatremia  Hypokalemia  NSTEMI (non-ST elevated myocardial infarction) [facility](HCC)    ED Discharge Orders    None       Glynn Octaveancour, Aribella Vavra, MD 09/05/17 [phone]

## 2017-09-05 NOTE — Progress Notes (Signed)
Spoke with Junius Roadsroyce, RN regarding PICC placement. RN stated MD wants to cancel PICC order  for now.  Patient will not be receiving 3% Normal Saline solution.  RN to discontinue PICC order.

## 2017-09-05 NOTE — ED Notes (Signed)
Pt has red rash above IV site where Vancomycin is infusing. Dr Manus Gunningancour notified and new orders given and carried out accordingly.

## 2017-09-05 NOTE — Progress Notes (Signed)
ANTIBIOTIC CONSULT NOTE-Preliminary  Pharmacy Consult for vancomycin Indication: intra-abdominal infection  Allergies  Allergen Reactions  . Aspirin Other (See Comments)    Extreme drowsiness    Patient Measurements: Height: 5\' 4"  (162.6 cm) Weight: 150 lb (68 kg) IBW/kg (Calculated) : 54.7 Adjusted Body Weight:   Vital Signs: Temp: 97.7 F (36.5 C) (08/18 0027) Temp Source: Oral (08/18 0027) BP: 70/60 (08/18 0130) Pulse Rate: 96 (08/18 0100)  Labs: Recent Labs    09/05/17 0020 09/05/17 0138  WBC 21.1*  --   HGB 15.5*  --   PLT 345  --   CREATININE 0.48 0.35*    Estimated Creatinine Clearance: 84.1 mL/min (A) (by C-G formula based on SCr of 0.35 mg/dL (L)).  No results for input(s): VANCOTROUGH, VANCOPEAK, VANCORANDOM, GENTTROUGH, GENTPEAK, GENTRANDOM, TOBRATROUGH, TOBRAPEAK, TOBRARND, AMIKACINPEAK, AMIKACINTROU, AMIKACIN in the last 72 hours.   Microbiology: No results found for this or any previous visit (from the past 720 hour(s)).  Medical History: Past Medical History:  Diagnosis Date  . Bulimia   . Dementia 10/2015  . Thyroid disease     Medications:  Scheduled:  . ondansetron (ZOFRAN) IV  4 mg Intravenous Once  . potassium chloride SA  40 mEq Oral Once   Infusions:  . sodium chloride 1,000 mL (09/05/17 0154)   And  . sodium chloride 1,000 mL (09/05/17 0154)   And  . sodium chloride    . octreotide  (SANDOSTATIN)    IV infusion 50 mcg/hr (09/05/17 0156)  . pantoprozole (PROTONIX) infusion    . phenylephrine (NEO-SYNEPHRINE) Adult infusion    . piperacillin-tazobactam    . potassium chloride    . vancomycin     Anti-infectives (From admission, onward)   Start     Dose/Rate Route Frequency Ordered Stop   09/05/17 0215  vancomycin (VANCOCIN) IVPB 1000 mg/200 mL premix     1,000 mg 200 mL/hr over 60 Minutes Intravenous  Once 09/05/17 0211     09/05/17 0200  piperacillin-tazobactam (ZOSYN) IVPB 3.375 g     3.375 g 100 mL/hr over 30 Minutes  Intravenous  Once 09/05/17 0145        Assessment: 47 yo female from nursing facility presented with coffee ground emesis and areas of bruising.  Starting vancomycin and zosyn for intra-abdominal infection.   Goal of Therapy:  Vancomycin trough level 15-20 mcg/ml  Plan:  Preliminary review of pertinent patient information completed.  Protocol will be initiated with dose(s) of vancomycin 1 gram.  Jeani HawkingAnnie Penn clinical pharmacist will complete review during morning rounds to assess patient and finalize treatment regimen if needed.  Martasia Talamante Scarlett, RPH 09/05/2017,2:13 AM

## 2017-09-05 NOTE — ED Notes (Signed)
CRITICAL VALUE ALERT  Critical Value: Troponin 1.22 Date & Time Notied: 09/05/17 @0140  Provider Notified:Rancour Orders Received/Actions taken: Redraw and rerun

## 2017-09-05 NOTE — ED Notes (Signed)
HCG was negative.

## 2017-09-06 DIAGNOSIS — G9340 Encephalopathy, unspecified: Secondary | ICD-10-CM

## 2017-09-06 DIAGNOSIS — E871 Hypo-osmolality and hyponatremia: Principal | ICD-10-CM

## 2017-09-06 DIAGNOSIS — R571 Hypovolemic shock: Secondary | ICD-10-CM

## 2017-09-06 LAB — BASIC METABOLIC PANEL
ANION GAP: 7 (ref 5–15)
ANION GAP: 6 (ref 5–15)
ANION GAP: 6 (ref 5–15)
Anion gap: 8 (ref 5–15)
Anion gap: 8 (ref 5–15)
BUN: <5 mg/dL — ABNORMAL LOW (ref 6–20)
CALCIUM: 7.7 mg/dL — AB (ref 8.9–10.3)
CALCIUM: 7.6 mg/dL — AB (ref 8.9–10.3)
CHLORIDE: 112 mmol/L — AB (ref 98–111)
CO2: 20 mmol/L — AB (ref 22–32)
CO2: 20 mmol/L — AB (ref 22–32)
CO2: 22 mmol/L (ref 22–32)
CO2: 23 mmol/L (ref 22–32)
CO2: 24 mmol/L (ref 22–32)
Calcium: 7.5 mg/dL — ABNORMAL LOW (ref 8.9–10.3)
Calcium: 7.7 mg/dL — ABNORMAL LOW (ref 8.9–10.3)
Calcium: 7.8 mg/dL — ABNORMAL LOW (ref 8.9–10.3)
Chloride: 103 mmol/L (ref 98–111)
Chloride: 107 mmol/L (ref 98–111)
Chloride: 108 mmol/L (ref 98–111)
Chloride: 110 mmol/L (ref 98–111)
Creatinine, Ser: 0.81 mg/dL (ref 0.44–1.00)
Creatinine, Ser: 0.82 mg/dL (ref 0.44–1.00)
Creatinine, Ser: 0.86 mg/dL (ref 0.44–1.00)
Creatinine, Ser: 0.86 mg/dL (ref 0.44–1.00)
Creatinine, Ser: 0.87 mg/dL (ref 0.44–1.00)
GFR calc Af Amer: >60 mL/min (ref >60)
GFR calc Af Amer: >60 mL/min (ref >60)
GFR calc Af Amer: >60 mL/min (ref >60)
GFR calc Af Amer: >60 mL/min (ref >60)
GFR calc Af Amer: >60 mL/min (ref >60)
GLUCOSE: 141 mg/dL — AB (ref 70–99)
GLUCOSE: 158 mg/dL — AB (ref 70–99)
GLUCOSE: 99 mg/dL (ref 70–99)
Glucose, Bld: 133 mg/dL — ABNORMAL HIGH (ref 70–99)
Glucose, Bld: 112 mg/dL — ABNORMAL HIGH (ref 70–99)
POTASSIUM: 3.8 mmol/L (ref 3.5–5.1)
POTASSIUM: 4 mmol/L (ref 3.5–5.1)
POTASSIUM: 4 mmol/L (ref 3.5–5.1)
POTASSIUM: 4.2 mmol/L (ref 3.5–5.1)
POTASSIUM: 4.3 mmol/L (ref 3.5–5.1)
SODIUM: 138 mmol/L (ref 135–145)
SODIUM: 139 mmol/L (ref 135–145)
Sodium: 131 mmol/L — ABNORMAL LOW (ref 135–145)
Sodium: 137 mmol/L (ref 135–145)
Sodium: 139 mmol/L (ref 135–145)

## 2017-09-06 LAB — PROCALCITONIN: PROCALCITONIN: 13.19 ng/mL

## 2017-09-06 LAB — GLUCOSE, CAPILLARY
GLUCOSE-CAPILLARY: 110 mg/dL — AB (ref 70–99)
GLUCOSE-CAPILLARY: 94 mg/dL (ref 70–99)
Glucose-Capillary: 102 mg/dL — ABNORMAL HIGH (ref 70–99)
Glucose-Capillary: 119 mg/dL — ABNORMAL HIGH (ref 70–99)
Glucose-Capillary: 127 mg/dL — ABNORMAL HIGH (ref 70–99)
Glucose-Capillary: 147 mg/dL — ABNORMAL HIGH (ref 70–99)
Glucose-Capillary: 97 mg/dL (ref 70–99)

## 2017-09-06 LAB — CBC
HCT: 36 % (ref 36.0–46.0)
Hemoglobin: 12.2 g/dL (ref 12.0–15.0)
MCH: 29 pg (ref 26.0–34.0)
MCHC: 33.9 g/dL (ref 30.0–36.0)
MCV: 85.7 fL (ref 78.0–100.0)
Platelets: 319 10*3/uL (ref 150–400)
RBC: 4.2 MIL/uL (ref 3.87–5.11)
RDW: 15.6 % — AB (ref 11.5–15.5)
WBC: 12.3 10*3/uL — ABNORMAL HIGH (ref 4.0–10.5)

## 2017-09-06 LAB — MAGNESIUM: MAGNESIUM: 2.5 mg/dL — AB (ref 1.7–2.4)

## 2017-09-06 LAB — PHOSPHORUS: PHOSPHORUS: 1.9 mg/dL — AB (ref 2.5–4.6)

## 2017-09-06 LAB — TROPONIN I
TROPONIN I: 0.27 ng/mL — AB (ref <0.03)
TROPONIN I: 0.26 ng/mL — AB (ref <0.03)

## 2017-09-06 MED ORDER — ENSURE ENLIVE PO LIQD
237.0000 mL | Freq: Two times a day (BID) | ORAL | Status: DC
  Administered 2017-09-06 – 2017-09-09 (×6): 237 mL via ORAL

## 2017-09-06 MED ORDER — SODIUM PHOSPHATES 45 MMOLE/15ML IV SOLN
20.0000 mmol | Freq: Once | INTRAVENOUS | Status: DC
  Filled 2017-09-06: qty 6.67

## 2017-09-06 MED ORDER — DEXTROSE 5 % IV SOLN
INTRAVENOUS | Status: DC
  Administered 2017-09-06: 10:00:00 via INTRAVENOUS

## 2017-09-06 MED ORDER — POTASSIUM PHOSPHATES 15 MMOLE/5ML IV SOLN
20.0000 mmol | Freq: Once | INTRAVENOUS | Status: AC
  Administered 2017-09-06: 20 mmol via INTRAVENOUS
  Filled 2017-09-06: qty 6.67

## 2017-09-06 MED ORDER — DEXTROSE-NACL 5-0.45 % IV SOLN
INTRAVENOUS | Status: DC
  Administered 2017-09-06 – 2017-09-08 (×6): via INTRAVENOUS

## 2017-09-06 NOTE — Progress Notes (Addendum)
Subjective: Interval History: pt with no complaints today.  Still oriented to self, no headache or pain- very flat  Objective: Vital signs in last 24 hours: Temp:  [98.6 F (37 C)-99.1 F (37.3 C)] 99.1 F (37.3 C) (08/19 0744) Pulse Rate:  [64-155] 68 (08/19 0630) Resp:  [14-26] 21 (08/19 0630) BP: (80-104)/(51-83) 83/51 (08/19 0630) SpO2:  [92 %-100 %] 96 % (08/19 0630) Weight:  [70.9 kg] 70.9 kg (08/19 0500) Weight change: 2.86 kg  Intake/Output from previous day: 08/18 0701 - 08/19 0700 In: 1056.7 [P.O.:450; I.V.:206.7; IV Piggyback:400] Out: 3785 [Urine:3785] Intake/Output this shift: No intake/output data recorded.  Cardiac: normal rate and rhythm, clear s1 and s2 Pulmonary: CTAB, not in distress Abdominal: non distended abdomen, soft and nontender Extremities: no LE  edema Mental status: Alert, only oriented to self  Lab Results: Recent Labs    09/05/17 0709 09/05/17 0710 09/06/17 0328  WBC 9.6  --  12.3*  HGB 10.7* 15.3* 12.2  HCT 30.7* 45.0 36.0  PLT 276  --  319   BMET:  Recent Labs    09/06/17 0328 09/06/17 1038  NA 137 139  K 4.2 4.3  CL 107 112*  CO2 22 20*  GLUCOSE 99 141*  BUN <5* <5*  CREATININE 0.81 0.82  CALCIUM 7.8* 7.7*   No results for input(s): PTH in the last 72 hours. Iron Studies: No results for input(s): IRON, TIBC, TRANSFERRIN, FERRITIN in the last 72 hours. CBG (last 3)  Recent Labs    09/06/17 0012 09/06/17 0427 09/06/17 0743  GLUCAP 147* 97 110*     Studies/Results: Ct Abdomen Pelvis Wo Contrast  Result Date: 09/05/2017 CLINICAL DATA:  Nausea and vomiting with abdominal distention. Elevated creatinine. EXAM: CT ABDOMEN AND PELVIS WITHOUT CONTRAST TECHNIQUE: Multidetector CT imaging of the abdomen and pelvis was performed following the standard protocol without IV contrast. COMPARISON:  None. FINDINGS: Lower chest: Atelectasis in both lung bases. 5.5 mm nodule in the anterior right middle lung. Hepatobiliary: No focal  liver abnormality is seen. No gallstones, gallbladder wall thickening, or biliary dilatation. Pancreas: Unremarkable. No pancreatic ductal dilatation or surrounding inflammatory changes. Spleen: Normal in size without focal abnormality. Adrenals/Urinary Tract: Adrenal glands are unremarkable. Kidneys are normal, without renal calculi, focal lesion, or hydronephrosis. Bladder is unremarkable. Stomach/Bowel: Stomach, small bowel, and colon are mostly decompressed. No inflammatory changes although evaluation of the bowel wall is limited due to under distention. Appendix is normal. Vascular/Lymphatic: Aortic atherosclerosis. No enlarged abdominal or pelvic lymph nodes. Flattening of the inferior vena cava suggest hypovolemia. Reproductive: Uterus and bilateral adnexa are unremarkable. Other: No abdominal wall hernia or abnormality. No abdominopelvic ascites. Musculoskeletal: Schmorl's nodes at L2 and L3. Degenerative changes in the lumbar spine. Degenerative changes in the left hip. Postoperative right hip arthroplasty. No destructive bone lesions. IMPRESSION: 1. No acute process demonstrated in the abdomen or pelvis. No evidence of bowel obstruction or inflammation. 2. 5.5 mm nodule in the right middle lung. No follow-up needed if patient is low-risk. Non-contrast chest CT can be considered in 12 months if patient is high-risk. This recommendation follows the consensus statement: Guidelines for Management of Incidental Pulmonary Nodules Detected on CT Images: From the Fleischner Society 2017; Radiology 2017; 284:228-243. 3. Aortic atherosclerosis. 4. Flattened IVC suggest hypovolemia. Electronically Signed   By: Burman NievesWilliam  Stevens M.D.   On: 09/05/2017 04:20   Ct Head Wo Contrast  Result Date: 09/04/2017 CLINICAL DATA:  47 year old female with acute headache following fall yesterday. Initial encounter. EXAM:  CT HEAD WITHOUT CONTRAST TECHNIQUE: Contiguous axial images were obtained from the base of the skull through  the vertex without intravenous contrast. COMPARISON:  06/30/2016 temporal bone CT FINDINGS: Brain: No evidence of acute infarction, hemorrhage, hydrocephalus, extra-axial collection or mass lesion/mass effect. Vascular: No hyperdense vessel or unexpected calcification. Skull: No acute abnormality. Sinuses/Orbits: No acute abnormality. LEFT temporal/mastoid surgical changes again noted with LEFT mastoid effusion. Other: None IMPRESSION: 1. No evidence of acute intracranial abnormality Electronically Signed   By: Harmon PierJeffrey  Hu M.D.   On: 09/04/2017 11:45   Ct Chest W Contrast  Result Date: 09/05/2017 CLINICAL DATA:  Lung nodule EXAM: CT CHEST WITH CONTRAST TECHNIQUE: Multidetector CT imaging of the chest was performed during intravenous contrast administration. CONTRAST:  75mL OMNIPAQUE IOHEXOL 300 MG/ML  SOLN COMPARISON:  CT 09/05/2017, radiograph 09/05/2016 FINDINGS: Cardiovascular: Nonaneurysmal aorta. Coronary vascular calcification. Normal heart size. No significant pericardial effusion Mediastinum/Nodes: No enlarged mediastinal, hilar, or axillary lymph nodes. Thyroid gland, trachea, and esophagus demonstrate no significant findings. Lungs/Pleura: Minimal emphysema. 5 x 6 mm right middle lobe pulmonary nodule, average diameter 6 mm, series 7, image number 79. No pleural effusion. No pneumothorax Upper Abdomen: Steatosis Musculoskeletal: No chest wall abnormality. No acute or significant osseous findings. IMPRESSION: 1. 6 mm average diameter right middle lobe pulmonary nodule. Non-contrast chest CT at 6-12 months is recommended. If the nodule is stable at time of repeat CT, then future CT at 18-24 months (from today's scan) is considered optional for low-risk patients, but is recommended for high-risk patients. This recommendation follows the consensus statement: Guidelines for Management of Incidental Pulmonary Nodules Detected on CT Images: From the Fleischner Society 2017; Radiology 2017; 284:228-243. 2.  Minimal emphysema Emphysema (ICD10-J43.9). Electronically Signed   By: Jasmine PangKim  Fujinaga M.D.   On: 09/05/2017 22:28   Dg Chest Portable 1 View  Result Date: 09/05/2017 CLINICAL DATA:  Coffee-ground emesis. Sepsis. EXAM: PORTABLE CHEST 1 VIEW COMPARISON:  09/15/2013 FINDINGS: Shallow inspiration. Heart size and pulmonary vascularity are normal. No airspace disease or consolidation in the lungs. No blunting of costophrenic angles. No pneumothorax. Mediastinal contours appear intact. IMPRESSION: No active disease. Electronically Signed   By: Burman NievesWilliam  Stevens M.D.   On: 09/05/2017 02:37   Dg Abd Portable 2 Views  Result Date: 09/05/2017 CLINICAL DATA:  Coffee ground emesis. EXAM: PORTABLE ABDOMEN - 2 VIEW COMPARISON:  None. FINDINGS: Examination is technically limited due to under penetration. As visualized, there does not appear to be any significant large or small bowel distention although gas-filled bowel loops are present. No radiopaque stones are identified. No free intra-abdominal air. Postoperative changes in the right hip. IMPRESSION: Technically limited study. No gross evidence of bowel obstruction. Electronically Signed   By: Burman NievesWilliam  Stevens M.D.   On: 09/05/2017 02:38    I have reviewed the patient's current medications.  Assessment/Plan: Hypovolemic Hyponatremia: This is likely a sequale of poor oral intake of osms, pt also has psychogenic polydipsia. Also worsened by low bp.  On phenylephrine.   -Na 137 this am continues to rise rapidly.  Brain likely tolerating well due to past fluctuations from polydipsia and volume loss -will transition to D5 1/2 NS due to large volume urine output   Hypokalemia: likely also due to poor oral intake This has been a chronic situation for her and kidneys have adapted with slowly lowering urine K   -wnl this am -continue to monitor and replete as needed   Hypocalcemia: albumin low and corrected 8.9 after yesterday's repletion   -  continue to monitor    Prolonged QT: mag, k and calcium now replete   -continue to monitor   Hypophosphatemia: still low will require repletion   -repleting through IV   Hypothyroidism: TSH wnl    Troponin elevation: likely demand-management per primary   -management per primary   Malnutrition: per primary can correct phos to avoid refeeding syndrome and feed pt     LOS: 1 day   Thornell Mule 09/06/2017,8:03 AM   Patient seen and examined, agree with above note with above modifications. Potassium, calcium and mag normalizing nicely.  She is having copious amounts of UOP- 4 liters yest and 1500 today and is hypotensive-  Needs more in right now- have started d51/2 normal at 125 per hour to keep up with her losses as able- on pressors but could also bolus  With IVF as needed- per CCM.  Dont know if she has post kidney injury diuresis or the amount of urine also brings to mind DI but would be very unusual to be able to over correct your sodium down in that circumstance - cont to follow sodium- I am less concerned about rapid correction because of the etiology of her hyponatremia  Annie Sable, MD 09/06/2017

## 2017-09-06 NOTE — Progress Notes (Addendum)
HISTORY & PHYSICAL  Patient Name: Bailey Alexander MRN: [id number] DOB: 1970-05-02    ADMISSION DATE:  09/05/2017 DATE OF SERVICE: 09/05/2017  CHIEF COMPLAINT: Hyponatremia, hypokalemia   HISTORY OF PRESENT ILLNESS  This 47 y.o. Caucasian female reformed smoker and nursing home resident transferred to [facility] Emergency Department [facility] for further evaluation and management of severe hyponatremia and hypokalemia. She presented to [facility] ED after fall with altered mental status. She was found to have several metabolic derangements including hyponatremia (111), hypokalemia, hypomagnesemia. [facility] and nephrology consultation.    VITAL SIGNS: BP (!) 83/51   Pulse 68   Temp 99.1 F (37.3 C) (Oral)   Resp (!) 21   Ht 5\' 4"  (1.626 m)   Wt 70.9 kg   LMP  (LMP Unknown)   SpO2 96%   BMI 26.83 kg/m   INTAKE / OUTPUT: I/O last 3 completed shifts: In: 3003.3 [P.O.:450; I.V.:206.7; IV Piggyback:2346.7] Out: 4185 [Urine:4185]  PHYSICAL EXAMINATION:  General:  Adult female who appears older than stated age.  Neuro:  Alert, oriented to person and place, "2012". Pleasant and conversant.  HEENT:  [place]/AT, No JVD noted, PERRL Cardiovascular:  RRR, no MRG Lungs:  Clear Abdomen:  Soft, non-distended Musculoskeletal:  No acute deformity Skin:  Intact, MMM  LABS:  BASIC METABOLIC PROFILE Recent Labs  Lab 09/05/17 0138 09/05/17 0709  09/05/17 2027 09/05/17 2349 09/06/17 0328  NA 111* 124*   < > 131* 131* 137  K 1.9* 3.4*   < > 4.4 4.0 4.2  CL 81* 90*   < > 102 103 107  CO2 21* 19*   < > 18* 20* 22  BUN <5* <5*   < > <5* <5* <5*  CREATININE 0.35* 0.44   < > 0.83 0.86 0.81  GLUCOSE 110* 143*   < > 146* 158* 99  CALCIUM 6.7* 6.7*   < > 7.8* 7.5* 7.8*  MG 1.2* 1.9  --   --   --  2.5*  PHOS  --  2.1*  --   --   --  1.9*   < > = values in this interval not displayed.    Liver Enzymes Recent Labs    Lab 09/05/17 0020 09/05/17 0709  AST 71* 54*  ALT 37 24  ALKPHOS 80 70  BILITOT 1.8* 2.0*  ALBUMIN 3.4* 2.6*    CBC Recent Labs  Lab 09/05/17 0020 09/05/17 0709 09/05/17 0710 09/06/17 0328  WBC 21.1* 9.6  --  12.3*  HGB 15.5* 10.7* 15.3* 12.2  HCT 42.0 30.7* 45.0 36.0  PLT 345 276  --  319    COAGULATION STUDIES Recent Labs  Lab 09/05/17 0035  APTT 27  INR 1.01    SEPSIS MARKERS Recent Labs  Lab 09/05/17 0145 09/05/17 0709 09/06/17 0328  LATICACIDVEN 1.06  --   --   PROCALCITON  --  12.39 13.19    ABG Recent Labs  Lab 09/05/17 0300 09/05/17 0931  PHART 7.506* 7.387  PCO2ART 27.6* 38.6  PO2ART 97.7 26.0*    Cardiac Enzymes Recent Labs  Lab 09/05/17 0709 09/05/17 1413 09/05/17 2027  TROPONINI 0.64* 0.45* 0.52*    STUDIES Echo 8/18 > LVEF 55-60%. Grade 1 DD. Trivial effusion.  CT chest  8/18 > 6 mm nodule in the right middle lung. No follow-up needed if patient is low-risk. Non-contrast chest CT can be considered in 12 months if patient  is high-risk.  CT abd/pelv 8/18 > No acute process demonstrated in the abdomen or pelvis. No evidence of bowel obstruction or inflammation.  Imaging Ct Chest W Contrast  Result Date: 09/05/2017 CLINICAL DATA:  Lung nodule EXAM: CT CHEST WITH CONTRAST TECHNIQUE: Multidetector CT imaging of the chest was performed during intravenous contrast administration. CONTRAST:  75mL OMNIPAQUE IOHEXOL 300 MG/ML  SOLN COMPARISON:  CT 09/05/2017, radiograph 09/05/2016 FINDINGS: Cardiovascular: Nonaneurysmal aorta. Coronary vascular calcification. Normal heart size. No significant pericardial effusion Mediastinum/Nodes: No enlarged mediastinal, hilar, or axillary lymph nodes. Thyroid gland, trachea, and esophagus demonstrate no significant findings. Lungs/Pleura: Minimal emphysema. 5 x 6 mm right middle lobe pulmonary nodule, average diameter 6 mm, series 7, image number 79. No pleural effusion. No pneumothorax Upper Abdomen:  Steatosis Musculoskeletal: No chest wall abnormality. No acute or significant osseous findings. IMPRESSION: 1. 6 mm average diameter right middle lobe pulmonary nodule. Non-contrast chest CT at 6-12 months is recommended. If the nodule is stable at time of repeat CT, then future CT at 18-24 months (from today's scan) is considered optional for low-risk patients, but is recommended for high-risk patients. This recommendation follows the consensus statement: Guidelines for Management of Incidental Pulmonary Nodules Detected on CT Images: From the Fleischner Society 2017; Radiology 2017; 284:228-243. 2. Minimal emphysema Emphysema (ICD10-J43.9). Electronically Signed   By: Jasmine Pang M.D.   On: 09/05/2017 22:28   Cultures: Blood 8/18 > Urine 8/18 >  ASSESSMENT / PLAN:  Hypovolemic hyponatremia - compulsive water drinker, but did seem volume down on exam. Corrected rather quickly.  Hypokalemia Hypomgnesemia  Hypophosphatemia - Nephrology following, started D5 to slow/reverse rate of correction.  - Follow BMP - Kphos  Hypotension suspect hypovolemic in nature, but could potentially be septic. No clear source, but WBC and PCT up. Lactic acid WNL. Echo with nml EF and grade 1 DD.  - Continue phenylephrine titrated to MAP (only now) - IVF hydration as above. - Continue antibiotics. Await cultures. Low threshold to DC.   Troponin elevation - mild, but has not clearly begun to trend down yet.  - Continue to trend  Prolonged QTC - Repeat EKG now that metabolic issues correcting.   Hypothyroid - Continue Synthroid   Dementia, probably multifactorial - supportive care  Malnutrition - History of vitamin ADEK deficiencies. Prealbumin 9.9 - Restart diet, close monitoring at risk refeeding syndrome.    Pulmonary nodule, right middle lobe Curiously, the presence of this nodules raises the question of possible SIADH. - Outpatient follow up.     FAMILY - Updates: no family at  bedside 8/19   - Inter-disciplinary family meet or Palliative Care meeting due by: 09/11/2017  Joneen Roach, AGACNP-BC [place] [facility]/Critical Care Pager [phone] or [phone]  09/06/2017 9:50 AM  Attending Note:  47 year old female with dementia and psychogenic polydipsia who presents to PCCM with AMS and severe hyponatremia.  PCCM admitted for how profound the hyponatremia is and hypotension.  On exam, patient is responsive and appropriate, neuro exam is non-focal.  I reviewed CXR myself, no acute disease noted.  Discussed with PCCM-NP.  Will maintain on neo for BP support.  Will change to 1/2 NS given rate of rise of Na (111 to 139).  Frequent Na checks.  Hold in the ICU for closer monitoring.  The patient is critically ill with multiple organ systems failure and requires high complexity decision making for assessment and support, frequent evaluation and titration of therapies, application of advanced monitoring technologies and  extensive interpretation of multiple databases.   Critical Care Time devoted to patient care services described in this note is  32  Minutes. This time reflects time of care of this signee Dr Koren BoundWesam Yacoub. This critical care time does not reflect procedure time, or teaching time or supervisory time of PA/NP/Med student/Med Resident etc but could involve care discussion time.  Alyson ReedyWesam G. Yacoub, M.D. [facility] Care Medicine. Pager: [phone]. After hours pager: [phone]

## 2017-09-06 NOTE — Care Management (Signed)
NCM spoke to pt and states sister, Marylene Landngela is her contact. [facility] CSW referral for SNF. Isidoro DonningAlesia Shenise Wolgamott RN CCM Case Mgmt phone [phone]

## 2017-09-06 NOTE — Progress Notes (Signed)
Initial Nutrition Assessment  DOCUMENTATION CODES:   Not applicable  INTERVENTION:   Recommend Dysphagia III (Mechanical Soft) diet  Pt may benefit from fluid restriction to prevent patient from drinking excessive water/fluids  Ensure Enlive po BID, each supplement provides 350 kcal and 20 grams of protein  May benefit from smaller, more frequent meals  NUTRITION DIAGNOSIS:   Inadequate oral intake related to poor appetite, lethargy/confusion, acute illness, chronic illness as evidenced by per patient/family report, NPO status.   GOAL:   Patient will meet greater than or equal to 90% of their needs  MONITOR:   PO intake, Labs, Weight trends, Supplement acceptance  REASON FOR ASSESSMENT:   Malnutrition Screening Tool    ASSESSMENT:   47 yo female admitted with hypovolemic hyponatremia in setting of psychogenic polydipsia and poor oral intake' pt also with hypokalemia, hypomagnesemia, hypophosphatemia and hypotension. Pt has been residing in nursing home since January 2018. Pt with hx of ADEK deficiencies and hx of bulimia, anorexia and EtOH abuse. Pt is wheelchair bound   Pt is pleasantly confused, oriented to self. No family at bedside. Through out the interview, pt states "Things change so frequently, I really don't know" when asked questions regarding anything.  Pt reports she has a good appetite at baseline and she eats all throughout the day. Pt does not elaborate much on what she eats. Pt does indicate that she is always hungry and always thirsty; pt does indicate that they restrict her fluids  Diet just advanced to Heart Healthy.  Pt is edentulous, reports she has dentures but dentures aren't with her  Noted pt with bruising all over. Pt reports she falls a lot; she reports she is in a wheelchair because "they" don't like her to walk because she falls.   Pt making lots of urine; >4L yesterday, 1500 mL documented thus far today. Net negative 1L.. Current wt 71 kg  (156 pounds); pt reports her UBW is around 119 pounds. Per chart review, it appears pt weighed around 119 pounds in January 2018 (>1 year ago) and based on recent weights pt has gained weight  Labs: Phosphorus 1.9, BUN <5. Sodium 139 (wdl), Creatinine wdl Meds: D5-1/2 NS at 125 ml/hr, folic acid, mag sulfate, potassium phosphate, thiamine, MVI with minerals  NUTRITION - FOCUSED PHYSICAL EXAM:    Most Recent Value  Orbital Region  Mild depletion  Upper Arm Region  No depletion  Thoracic and Lumbar Region  No depletion  Buccal Region  Mild depletion  Temple Region  Mild depletion  Clavicle Bone Region  No depletion  Clavicle and Acromion Bone Region  No depletion  Scapular Bone Region  No depletion  Dorsal Hand  No depletion [hands appear swollen]  Patellar Region  Mild depletion  Anterior Thigh Region  Moderate depletion  Posterior Calf Region  Moderate depletion  Edema (RD Assessment)  Mild       Diet Order:   Diet Order            DIET DYS 3 Room service appropriate? Yes; Fluid consistency: Thin  Diet effective now              EDUCATION NEEDS:   No education needs have been identified at this time  Skin:  Skin Assessment: Reviewed RN Assessment  Last BM:  no documented BM  Height:   Ht Readings from Last 1 Encounters:  09/05/17 5\' 4"  (1.626 m)    Weight:   Wt Readings from Last 1 Encounters:  09/06/17  70.9 kg    Ideal Body Weight:     BMI:  Body mass index is 26.83 kg/m.  Estimated Nutritional Needs:   Kcal:  1750-1950 kcals   Protein:  85-95 g  Fluid:  per MD    Romelle Starcherate Stefania Goulart MS, RD, LDN, CNSC [phone] Pager  [phone] Weekend/On-Call Pager

## 2017-09-07 DIAGNOSIS — E876 Hypokalemia: Secondary | ICD-10-CM

## 2017-09-07 DIAGNOSIS — R6521 Severe sepsis with septic shock: Secondary | ICD-10-CM

## 2017-09-07 DIAGNOSIS — A419 Sepsis, unspecified organism: Secondary | ICD-10-CM

## 2017-09-07 DIAGNOSIS — R911 Solitary pulmonary nodule: Secondary | ICD-10-CM

## 2017-09-07 LAB — GLUCOSE, CAPILLARY
GLUCOSE-CAPILLARY: 101 mg/dL — AB (ref 70–99)
GLUCOSE-CAPILLARY: 100 mg/dL — AB (ref 70–99)
GLUCOSE-CAPILLARY: 110 mg/dL — AB (ref 70–99)
Glucose-Capillary: 109 mg/dL — ABNORMAL HIGH (ref 70–99)
Glucose-Capillary: 85 mg/dL (ref 70–99)
Glucose-Capillary: 121 mg/dL — ABNORMAL HIGH (ref 70–99)

## 2017-09-07 LAB — BASIC METABOLIC PANEL
ANION GAP: 8 (ref 5–15)
ANION GAP: 9 (ref 5–15)
BUN: 6 mg/dL (ref 6–20)
CALCIUM: 7.6 mg/dL — AB (ref 8.9–10.3)
CO2: 21 mmol/L — AB (ref 22–32)
CO2: 20 mmol/L — ABNORMAL LOW (ref 22–32)
Calcium: 7.9 mg/dL — ABNORMAL LOW (ref 8.9–10.3)
Chloride: 112 mmol/L — ABNORMAL HIGH (ref 98–111)
Chloride: 111 mmol/L (ref 98–111)
Creatinine, Ser: 0.78 mg/dL (ref 0.44–1.00)
Creatinine, Ser: 0.98 mg/dL (ref 0.44–1.00)
GFR calc Af Amer: >60 mL/min (ref >60)
GFR calc non Af Amer: >60 mL/min (ref >60)
GLUCOSE: 115 mg/dL — AB (ref 70–99)
Glucose, Bld: 113 mg/dL — ABNORMAL HIGH (ref 70–99)
POTASSIUM: 4.1 mmol/L (ref 3.5–5.1)
Potassium: 4.4 mmol/L (ref 3.5–5.1)
Sodium: 141 mmol/L (ref 135–145)
Sodium: 140 mmol/L (ref 135–145)

## 2017-09-07 LAB — TROPONIN I: Troponin I: 0.22 ng/mL (ref <0.03)

## 2017-09-07 LAB — PROCALCITONIN: Procalcitonin: 3.99 ng/mL

## 2017-09-07 LAB — CBC
HEMATOCRIT: 36.7 % (ref 36.0–46.0)
HEMOGLOBIN: 12.1 g/dL (ref 12.0–15.0)
MCH: 28.9 pg (ref 26.0–34.0)
MCHC: 33 g/dL (ref 30.0–36.0)
MCV: 87.6 fL (ref 78.0–100.0)
Platelets: 300 10*3/uL (ref 150–400)
RBC: 4.19 MIL/uL (ref 3.87–5.11)
RDW: 16.4 % — ABNORMAL HIGH (ref 11.5–15.5)
WBC: 12.2 10*3/uL — ABNORMAL HIGH (ref 4.0–10.5)

## 2017-09-07 LAB — URINE CULTURE: Culture: 30000 — AB

## 2017-09-07 LAB — MAGNESIUM: MAGNESIUM: 1.8 mg/dL (ref 1.7–2.4)

## 2017-09-07 LAB — PHOSPHORUS: Phosphorus: 2.9 mg/dL (ref 2.5–4.6)

## 2017-09-07 MED ORDER — HEPARIN SODIUM (PORCINE) 5000 UNIT/ML IJ SOLN
5000.0000 [IU] | Freq: Three times a day (TID) | INTRAMUSCULAR | Status: DC
  Administered 2017-09-07 – 2017-09-16 (×29): 5000 [IU] via SUBCUTANEOUS
  Filled 2017-09-07 (×26): qty 1

## 2017-09-07 NOTE — [place] FL2 (Signed)
[place] MEDICAID FL2 LEVEL OF CARE SCREENING TOOL     IDENTIFICATION  Patient Name: Bailey Alexander Birthdate: 03-Apr-1970 Sex: female Admission Date (Current Location): 09/05/2017  [facility] [place]  [place]   Facility and Address:  [facility], [address], [place], [place] [address]      Provider Number: [id number]  Attending Physician Name and Address:  Kalman Shanamaswamy, Murali, MD  Relative Name and Phone Number:       Current Level of Care: Hospital Recommended Level of Care: Skilled Nursing Facility Prior Approval Number:    Date Approved/Denied:   PASRR Number: [id number]  Discharge Plan: SNF    Current Diagnoses: Patient Active Problem List   Diagnosis Date Noted  . Hyponatremia 09/05/2017  . Hypokalemia 09/05/2017  . Hypomagnesemia 09/05/2017  . Cardiac enzymes elevated 09/05/2017  . Pulmonary nodule 09/05/2017  . Debility 02/11/2016  . Malnutrition (HCC) 01/31/2016  . Anorexia nervosa 01/31/2016  . Acquired hypothyroidism 01/31/2016  . Dementia 10/20/2015    Orientation RESPIRATION BLADDER Height & Weight     Self  Normal Incontinent, Indwelling catheter Weight: 153 lb (69.4 kg) Height:  5\' 4"  (162.6 cm)  BEHAVIORAL SYMPTOMS/MOOD NEUROLOGICAL BOWEL NUTRITION STATUS      Continent Diet(see DC summary)  AMBULATORY STATUS COMMUNICATION OF NEEDS Skin   Extensive Assist Verbally Normal                       Personal Care Assistance Level of Assistance  Bathing, Dressing Bathing Assistance: Maximum assistance   Dressing Assistance: Maximum assistance     Functional Limitations Info             SPECIAL CARE FACTORS FREQUENCY                       Contractures      Additional Factors Info  Code Status, Allergies, Insulin Sliding Scale Code Status Info: FULL Allergies Info: Abilify Aripiprazole   Insulin Sliding Scale Info: see DC summary       Current Medications (09/07/2017):  This is  the current hospital active medication list Current Facility-Administered Medications  Medication Dose Route Frequency Provider Last Rate Last Dose  . 0.9 %  sodium chloride infusion  250 mL Intravenous PRN Tobey GrimEubanks, Katalina M, NP      . dextrose 5 %-0.45 % sodium chloride infusion   Intravenous Continuous Annie SableGoldsborough, Kellie, MD 125 mL/hr at 09/07/17 0900    . feeding supplement (ENSURE ENLIVE) (ENSURE ENLIVE) liquid 237 mL  237 mL Oral BID BM Alyson ReedyYacoub, Wesam G, MD   237 mL at 09/07/17 0813  . folic acid injection 1 mg  1 mg Intravenous Daily Tobey GrimEubanks, Katalina M, NP   1 mg at 09/06/17 0934  . heparin injection 5,000 Units  5,000 Units Subcutaneous Q8H Duayne CalHoffman, Paul W, NP      . insulin aspart (novoLOG) injection 0-15 Units  0-15 Units Subcutaneous Q4H Tobey GrimEubanks, Katalina M, NP   2 Units at 09/06/17 1117  . levothyroxine (SYNTHROID, LEVOTHROID) tablet 50 mcg  50 mcg Oral QAC breakfast Tobey GrimEubanks, Katalina M, NP   50 mcg at 09/07/17 0813  . MEDLINE mouth rinse  15 mL Mouth Rinse BID Kalman Shanamaswamy, Murali, MD   15 mL at 09/06/17 0939  . multivitamin with minerals tablet 1 tablet  1 tablet Oral Daily Tobey GrimEubanks, Katalina M, NP   1 tablet at 09/06/17 0932  . phenylephrine (NEOSYNEPHRINE) 10-0.9 MG/250ML-% infusion  0-200  mcg/min Intravenous Titrated Tobey GrimEubanks, Katalina M, NP   Stopped at 09/07/17 [phone]  . piperacillin-tazobactam (ZOSYN) IVPB 3.375 g  3.375 g Intravenous Q8H Marcelle SmilingJeong, Seong-Joo, MD   Stopped at 09/07/17 [phone]  . thiamine (B-1) injection 100 mg  100 mg Intravenous Daily Jovita KussmaulEubanks, Katalina M, NP   100 mg at 09/06/17 0932     Discharge Medications: Please see discharge summary for a list of discharge medications.  Relevant Imaging Results:  Relevant Lab Results:   Additional Information SS#: [id number]  Burna SisUris, [place] Christman H, LCSW
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
## 2017-09-07 NOTE — Progress Notes (Addendum)
Subjective: Interval History: Pt denies SOB, now oriented to person and place but not time.  asks me if "she can stay with her parents"  4955 of UOP last 24 hours but trying to match ins  Objective: Vital signs in last 24 hours: Temp:  [97.9 F (36.6 C)-99.3 F (37.4 C)] 99.3 F (37.4 C) (08/20 0338) Pulse Rate:  [61-102] 72 (08/20 0615) Resp:  [13-28] 20 (08/20 0615) BP: (74-114)/(46-88) 98/60 (08/20 0615) SpO2:  [80 %-100 %] 96 % (08/20 0615) Weight:  [69.4 kg] 69.4 kg (08/20 0500) Weight change: -1.5 kg  Intake/Output from previous day: 08/19 0701 - 08/20 0700 In: 4593.3 [P.O.:50; I.V.:3821.4; IV Piggyback:721.9] Out: 4955 [Urine:4955] Intake/Output this shift: Total I/O In: 2024.1 [P.O.:50; I.V.:1884; IV Piggyback:90.1] Out: 2155 [Urine:2155]  Cardiac: normal rate and rhythm, clear s1 and s2 Pulmonary: CTAB, not in distress Abdominal: non distended abdomen, soft and nontender Extremities: no LE  edema Mental status: Alert oriented to place and self  Lab Results: Recent Labs    09/06/17 0328 09/07/17 0326  WBC 12.3* 12.2*  HGB 12.2 12.1  HCT 36.0 36.7  PLT 319 300   BMET:  Recent Labs    09/06/17 2303 09/07/17 0326  NA 139 141  K 3.8 4.1  CL 110 112*  CO2 23 21*  GLUCOSE 112* 115*  BUN <5* <5*  CREATININE 0.86 0.78  CALCIUM 7.6* 7.9*   No results for input(s): PTH in the last 72 hours. Iron Studies: No results for input(s): IRON, TIBC, TRANSFERRIN, FERRITIN in the last 72 hours. CBG (last 3)  Recent Labs    09/06/17 1951 09/06/17 2345 09/07/17 0331  GLUCAP 94 102* 109*     Studies/Results: Ct Chest W Contrast  Result Date: 09/05/2017 CLINICAL DATA:  Lung nodule EXAM: CT CHEST WITH CONTRAST TECHNIQUE: Multidetector CT imaging of the chest was performed during intravenous contrast administration. CONTRAST:  75mL OMNIPAQUE IOHEXOL 300 MG/ML  SOLN COMPARISON:  CT 09/05/2017, radiograph 09/05/2016 FINDINGS: Cardiovascular: Nonaneurysmal aorta.  Coronary vascular calcification. Normal heart size. No significant pericardial effusion Mediastinum/Nodes: No enlarged mediastinal, hilar, or axillary lymph nodes. Thyroid gland, trachea, and esophagus demonstrate no significant findings. Lungs/Pleura: Minimal emphysema. 5 x 6 mm right middle lobe pulmonary nodule, average diameter 6 mm, series 7, image number 79. No pleural effusion. No pneumothorax Upper Abdomen: Steatosis Musculoskeletal: No chest wall abnormality. No acute or significant osseous findings. IMPRESSION: 1. 6 mm average diameter right middle lobe pulmonary nodule. Non-contrast chest CT at 6-12 months is recommended. If the nodule is stable at time of repeat CT, then future CT at 18-24 months (from today's scan) is considered optional for low-risk patients, but is recommended for high-risk patients. This recommendation follows the consensus statement: Guidelines for Management of Incidental Pulmonary Nodules Detected on CT Images: From the Fleischner Society 2017; Radiology 2017; 284:228-243. 2. Minimal emphysema Emphysema (ICD10-J43.9). Electronically Signed   By: Jasmine PangKim  Fujinaga M.D.   On: 09/05/2017 22:28    I have reviewed the patient's current medications.  Assessment/Plan: Hypovolemic Hyponatremia: This is likely a sequale of poor oral intake of osms, pt also has psychogenic polydipsia. Also worsened by low bp.    -Na 141 this am had a precipitous rise during her hospital course.  Brain tolerating well, likely due to past fluctuations from polydipsia and cerebral volume loss -will continue  D5 1/2 NS due to continued large volume urine output try to keep up with losses -will check bmp this afternoon  Hypotension: believe this to be  from volume loss.  Trial off neo today.  Procalcitonin likely false positive from shock. Reviewed peripheral blood film and confirmed with hematolgist no toxic granulations or dohle bodies present.    -continue volume support, hopefully urine output will  drop off once ADH kicks in and pt will normalize and no longer require volume supplementation   Hypokalemia: likely also due to poor oral intake This has been a chronic situation for her and kidneys have adapted with slowly lowering urine K   -wnl this am -continue to monitor and replete as needed   Hypocalcemia: albumin low and corrected calcium 8.9 after repletion, stable   -continue to monitor   Prolonged QT: mag, k and calcium now replete   -continue to monitor   Hypophosphatemia: now replete   -continue to monitor   Hypothyroidism: TSH wnl    Troponin elevation: likely demand. Coming down   -management per primary   Malnutrition: per primary, nutrition team involved, phos now replete     LOS: 2 days   Thornell MuleBrandon Winfrey 09/07/2017,6:30 AM   Patient seen and examined, agree with above note with above modifications. Potassium, phos, calcium and mag normalizing nicely.  She is having copious amounts of UOP- almost 5 liters yest and is hypotensive-  Needs more in right now- will continue d5 1/2 normal at 125 per hour to keep up with her losses as able- on pressors but could also bolus  With IVF as needed- per CCM.  Dont know if she has post kidney injury diuresis or the amount of urine also brings to mind DI but would be very unusual to be able to over correct your sodium down in that circumstance - cont to follow sodium- I am less concerned about rapid correction because of the etiology of her hyponatremia but continue with 1/2 NS to hopefully not let it rise any more  Sai Zinn A

## 2017-09-08 DIAGNOSIS — I9589 Other hypotension: Secondary | ICD-10-CM

## 2017-09-08 DIAGNOSIS — E039 Hypothyroidism, unspecified: Secondary | ICD-10-CM

## 2017-09-08 DIAGNOSIS — E861 Hypovolemia: Secondary | ICD-10-CM

## 2017-09-08 LAB — BASIC METABOLIC PANEL
ANION GAP: 7 (ref 5–15)
BUN: 10 mg/dL (ref 6–20)
CO2: 22 mmol/L (ref 22–32)
Calcium: 7.5 mg/dL — ABNORMAL LOW (ref 8.9–10.3)
Chloride: 106 mmol/L (ref 98–111)
Creatinine, Ser: 1.65 mg/dL — ABNORMAL HIGH (ref 0.44–1.00)
GFR, EST AFRICAN AMERICAN: 42 mL/min — AB (ref >60)
GFR, EST NON AFRICAN AMERICAN: 37 mL/min — AB (ref >60)
GLUCOSE: 113 mg/dL — AB (ref 70–99)
POTASSIUM: 3.7 mmol/L (ref 3.5–5.1)
Sodium: 135 mmol/L (ref 135–145)

## 2017-09-08 LAB — CBC
HEMATOCRIT: 31.6 % — AB (ref 36.0–46.0)
HEMOGLOBIN: 10 g/dL — AB (ref 12.0–15.0)
MCH: 28.7 pg (ref 26.0–34.0)
MCHC: 31.6 g/dL (ref 30.0–36.0)
MCV: 90.5 fL (ref 78.0–100.0)
Platelets: 225 10*3/uL (ref 150–400)
RBC: 3.49 MIL/uL — AB (ref 3.87–5.11)
RDW: 16.2 % — ABNORMAL HIGH (ref 11.5–15.5)
WBC: 9.3 10*3/uL (ref 4.0–10.5)

## 2017-09-08 LAB — URINALYSIS, ROUTINE W REFLEX MICROSCOPIC
BILIRUBIN URINE: NEGATIVE
GLUCOSE, UA: NEGATIVE mg/dL
KETONES UR: NEGATIVE mg/dL
NITRITE: NEGATIVE
PH: 7 (ref 5.0–8.0)
Protein, ur: NEGATIVE mg/dL
Specific Gravity, Urine: 1.003 — ABNORMAL LOW (ref 1.005–1.030)

## 2017-09-08 LAB — GLUCOSE, CAPILLARY
GLUCOSE-CAPILLARY: 117 mg/dL — AB (ref 70–99)
Glucose-Capillary: 93 mg/dL (ref 70–99)
Glucose-Capillary: 92 mg/dL (ref 70–99)
Glucose-Capillary: 93 mg/dL (ref 70–99)
Glucose-Capillary: 104 mg/dL — ABNORMAL HIGH (ref 70–99)

## 2017-09-08 LAB — CREATININE, URINE, RANDOM: Creatinine, Urine: 10.57 mg/dL

## 2017-09-08 LAB — SODIUM, URINE, RANDOM: Sodium, Ur: 53 mmol/L

## 2017-09-08 LAB — MAGNESIUM: MAGNESIUM: 1.6 mg/dL — AB (ref 1.7–2.4)

## 2017-09-08 LAB — OSMOLALITY, URINE: OSMOLALITY UR: 140 mosm/kg — AB (ref 300–900)

## 2017-09-08 LAB — PHOSPHORUS: PHOSPHORUS: 2 mg/dL — AB (ref 2.5–4.6)

## 2017-09-08 MED ORDER — FOLIC ACID 1 MG PO TABS
1.0000 mg | ORAL_TABLET | Freq: Every day | ORAL | Status: DC
  Administered 2017-09-08 – 2017-09-16 (×9): 1 mg via ORAL
  Filled 2017-09-08 (×8): qty 1

## 2017-09-08 MED ORDER — SODIUM CHLORIDE 0.9 % IV SOLN
INTRAVENOUS | Status: AC
  Administered 2017-09-08: 19:00:00 via INTRAVENOUS

## 2017-09-08 MED ORDER — SODIUM PHOSPHATES 45 MMOLE/15ML IV SOLN
10.0000 mmol | Freq: Once | INTRAVENOUS | Status: AC
  Administered 2017-09-08: 10 mmol via INTRAVENOUS
  Filled 2017-09-08: qty 3.33

## 2017-09-08 MED ORDER — MAGNESIUM SULFATE 2 GM/50ML IV SOLN: 2.0000 g | Freq: Once | INTRAVENOUS | Status: DC

## 2017-09-08 MED ORDER — POTASSIUM CHLORIDE CRYS ER 20 MEQ PO TBCR
40.0000 meq | EXTENDED_RELEASE_TABLET | Freq: Once | ORAL | Status: AC
  Administered 2017-09-08: 40 meq via ORAL
  Filled 2017-09-08: qty 2

## 2017-09-08 MED ORDER — VITAMIN B-1 100 MG PO TABS
100.0000 mg | ORAL_TABLET | Freq: Every day | ORAL | Status: DC
  Administered 2017-09-08 – 2017-09-16 (×9): 100 mg via ORAL
  Filled 2017-09-08 (×8): qty 1

## 2017-09-08 MED ORDER — MAGNESIUM SULFATE 2 GM/50ML IV SOLN
2.0000 g | Freq: Once | INTRAVENOUS | Status: AC
  Administered 2017-09-08: 2 g via INTRAVENOUS
  Filled 2017-09-08: qty 50
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
## 2017-09-08 NOTE — Progress Notes (Signed)
PROGRESS NOTE  Bailey Alexander RUE:[id number] DOB: 11/24/1970 DOA: 09/05/2017 PCP: Bernerd LimboAriza, Fernando Enrique, MD  HPI/Recap of past 24 hours: 47 y.o. Caucasian female, nursing home resident transferred to [facility] Department from [facility] for further evaluation and management of severe hyponatremia and hypokalemia. [facility] after fall with altered mental status. She was found to have several metabolic derangements including hyponatremia (111), hypokalemia, hypomagnesemia with hypotension. [facility] and nephrology consultation. In the ICU pt was placed on pressors and currently weaned off. TRH assumed care on 09/08/17.   Today, pt reported feeling better, denies any new complaints. Noted to have decrease UO with rise in Cr. Nephrology on board.   Assessment/Plan: Principal Problem:   Hyponatremia Active Problems:   Malnutrition (HCC)   Acquired hypothyroidism   Hypokalemia   Hypomagnesemia   Cardiac enzymes elevated   Pulmonary nodule  Hypovolemic hyponatremia Currently Na WNL Hx of psychogenic polydipsia (restrict free water) Nephrology on board: Initially thought to be overcorrected and was placed on D5/1/2 NS, currently d/c and placed on gentle hydration with NS 50cc Monitor closely, daily renal panel  Hypotension, ??sepsis of unknown origin Afebrile, resolved leukocytosis, currently off pressors, BP soft, but stable BC X 2 NGTD UC grew 30,000 viridans strep, will repeat CXR no acute abnormality Continue IV hydration Continue IV Zosyn Monitor closely  AKI Cr 1.65, baseline WNL ??ATN from hypotension Nephrology on board: urine Na, osm, cr UA Continue IVF Daily renal panel  Electrolyte imbalance Hypokalemia/hypocalcemia/hypophosphatemia/hypomagnesemia ??Re-feeding syndrome Nephrology on board Nutrition on board Replace prn  Hypothyroidism Continue  Synthroid  Prolonged QTc Intermittent EKG    Code Status: Full  Family Communication: None at bedside  Disposition Plan: SNF   Consultants:  Nephrology  PCCM  Procedures:  None  Antimicrobials:  Zosyn  DVT prophylaxis:  Heparin [place]   Objective: Vitals:   09/08/17 0900 09/08/17 1000 09/08/17 1100 09/08/17 1200  BP:  (!) 100/53 (!) 98/53 114/65  Pulse: 86 86 89 87  Resp: (!) 24 20 (!) 26 (!) 24  Temp:   98.3 F (36.8 C)   TempSrc:   Oral   SpO2: 100% 100% 100% 100%  Weight:      Height:        Intake/Output Summary (Last 24 hours) at 09/08/2017 1429 Last data filed at 09/08/2017 1200 Gross per 24 hour  Intake 3055.77 ml  Output 905 ml  Net 2150.77 ml   Filed Weights   09/06/17 0500 09/07/17 0500 09/08/17 0500  Weight: 70.9 kg 69.4 kg 72.5 kg    Exam:   General: NAD  Cardiovascular: S1, S2 present   Respiratory: CTAB  Abdomen: Soft, NT, ND, BS present   Musculoskeletal: No pedal edema bilaterally  Skin: Normal  Psychiatry: Normal mood   Data Reviewed: CBC: Recent Labs  Lab 09/05/17 0020 09/05/17 0709 09/05/17 0710 09/06/17 0328 09/07/17 0326 09/08/17 0333  WBC 21.1* 9.6  --  12.3* 12.2* 9.3  HGB 15.5* 10.7* 15.3* 12.2 12.1 10.0*  HCT 42.0 30.7* 45.0 36.0 36.7 31.6*  MCV 79.8 83.7  --  85.7 87.6 90.5  PLT 345 276  --  319 300 225   Basic Metabolic Panel: Recent Labs  Lab 09/05/17 0138 09/05/17 0709  09/06/17 0328  09/06/17 1437 09/06/17 2303 09/07/17 0326 09/07/17 1543 09/08/17 0333  NA 111* 124*   < > 137   < > 138 139 141 140  135  K 1.9* 3.4*   < > 4.2   < > 4.0 3.8 4.1 4.4 3.7  CL 81* 90*   < > 107   < > 108 110 112* 111 106  CO2 21* 19*   < > 22   < > 24 23 21* 20* 22  GLUCOSE 110* 143*   < > 99   < > 133* 112* 115* 113* 113*  BUN <5* <5*   < > <5*   < > <5* <5* <5* 6 10  CREATININE 0.35* 0.44   < > 0.81   < > 0.87 0.86 0.78 0.98 1.65*  CALCIUM 6.7* 6.7*   < > 7.8*   < > 7.7* 7.6* 7.9* 7.6* 7.5*  MG 1.2* 1.9   --  2.5*  --   --   --  1.8  --  1.6*  PHOS  --  2.1*  --  1.9*  --   --   --  2.9  --  2.0*   < > = values in this interval not displayed.   GFR: Estimated Creatinine Clearance: 42 mL/min (A) (by C-G formula based on SCr of 1.65 mg/dL (H)). Liver Function Tests: Recent Labs  Lab 09/05/17 0020 09/05/17 0709  AST 71* 54*  ALT 37 24  ALKPHOS 80 70  BILITOT 1.8* 2.0*  PROT 6.1* 4.7*  ALBUMIN 3.4* 2.6*   Recent Labs  Lab 09/05/17 0035  LIPASE 21   Recent Labs  Lab 09/05/17 0048  AMMONIA 11   Coagulation Profile: Recent Labs  Lab 09/05/17 0035  INR 1.01   Cardiac Enzymes: Recent Labs  Lab 09/05/17 1413 09/05/17 2027 09/06/17 1038 09/06/17 1437 09/06/17 2303  TROPONINI 0.45* 0.52* 0.27* 0.26* 0.22*   BNP (last 3 results) No results for input(s): PROBNP in the last 8760 hours. HbA1C: No results for input(s): HGBA1C in the last 72 hours. CBG: Recent Labs  Lab 09/07/17 1949 09/07/17 2321 09/08/17 0320 09/08/17 0802 09/08/17 1130  GLUCAP 85 121* 117* 93 92   Lipid Profile: No results for input(s): CHOL, HDL, LDLCALC, TRIG, CHOLHDL, LDLDIRECT in the last 72 hours. Thyroid Function Tests: No results for input(s): TSH, T4TOTAL, FREET4, T3FREE, THYROIDAB in the last 72 hours. Anemia Panel: No results for input(s): VITAMINB12, FOLATE, FERRITIN, TIBC, IRON, RETICCTPCT in the last 72 hours. Urine analysis:    Component Value Date/Time   COLORURINE STRAW (A) 09/08/2017 0802   APPEARANCEUR CLEAR 09/08/2017 0802   LABSPEC 1.003 (L) 09/08/2017 0802   PHURINE 7.0 09/08/2017 0802   GLUCOSEU NEGATIVE 09/08/2017 0802   HGBUR SMALL (A) 09/08/2017 0802   BILIRUBINUR NEGATIVE 09/08/2017 0802   KETONESUR NEGATIVE 09/08/2017 0802   PROTEINUR NEGATIVE 09/08/2017 0802   NITRITE NEGATIVE 09/08/2017 0802   LEUKOCYTESUR MODERATE (A) 09/08/2017 0802   Sepsis Labs: @LABRCNTIP (procalcitonin:4,lacticidven:4)  ) Recent Results (from the past 240 hour(s))  Urine Culture      Status: Abnormal   Collection Time: 09/04/17 10:27 AM  Result Value Ref Range Status   Specimen Description   Final    URINE, CLEAN CATCH Performed at [facility], [address] [place], [place] [address]    Special Requests   Final    NONE Performed at [facility], [address] [place], [place] [address]    Culture 30,000 COLONIES/mL VIRIDANS STREPTOCOCCUS (A)  Final   Report Status 09/07/2017 FINAL  Final  Blood culture (routine x 2)     Status: None (Preliminary result)   Collection Time:  09/05/17  2:27 AM  Result Value Ref Range Status   Specimen Description LEFT ANTECUBITAL  Final   Special Requests   Final    BOTTLES DRAWN AEROBIC AND ANAEROBIC Blood Culture adequate volume   Culture   Final    NO GROWTH 3 DAYS Performed at [facility], [address] [place], [place] [address]    Report Status PENDING  Incomplete  Blood culture (routine x 2)     Status: None (Preliminary result)   Collection Time: 09/05/17  2:36 AM  Result Value Ref Range Status   Specimen Description BLOOD LEFT HAND  Final   Special Requests   Final    BOTTLES DRAWN AEROBIC ONLY Blood Culture results may not be optimal due to an inadequate volume of blood received in culture bottles   Culture   Final    NO GROWTH 3 DAYS Performed at [facility], [address], [place], [place] [address]    Report Status PENDING  Incomplete  MRSA PCR Screening     Status: None   Collection Time: 09/05/17  8:54 AM  Result Value Ref Range Status   MRSA by PCR NEGATIVE NEGATIVE Final    Comment:        The GeneXpert MRSA Assay (FDA approved for NASAL specimens only), is one component of a comprehensive MRSA colonization surveillance program. It is not intended to diagnose MRSA infection nor to guide or monitor treatment for MRSA infections. Performed at [facility], [address], [place], [place] [address]       Studies: No results found.  Scheduled Meds: . feeding supplement (ENSURE  ENLIVE)  237 mL Oral BID BM  . folic acid  1 mg Oral Daily  . heparin injection (subcutaneous)  5,000 Units Subcutaneous Q8H  . insulin aspart  0-15 Units Subcutaneous Q4H  . levothyroxine  50 mcg Oral QAC breakfast  . mouth rinse  15 mL Mouth Rinse BID  . multivitamin with minerals  1 tablet Oral Daily  . thiamine  100 mg Oral Daily    Continuous Infusions: . sodium chloride 250 mL (09/08/17 1048)  . sodium chloride    . phenylephrine (NEO-SYNEPHRINE) Adult infusion Stopped (09/07/17 8295)  . piperacillin-tazobactam (ZOSYN)  IV 3.375 g (09/08/17 1049)  . sodium phosphate  Dextrose 5% IVPB 10 mmol (09/08/17 1256)     LOS: 3 days     Briant Cedar, MD [facility]   If 7PM-7AM, please contact night-coverage [email or web address] Password [facility] 09/08/2017, 2:29 PM

## 2017-09-09 LAB — CBC WITH DIFFERENTIAL/PLATELET
Abs Immature Granulocytes: 0.1 10*3/uL (ref 0.0–0.1)
Basophils Absolute: 0 10*3/uL (ref 0.0–0.1)
Basophils Relative: 0 %
EOS PCT: 3 %
Eosinophils Absolute: 0.2 10*3/uL (ref 0.0–0.7)
HEMATOCRIT: 32.2 % — AB (ref 36.0–46.0)
HEMOGLOBIN: 10.3 g/dL — AB (ref 12.0–15.0)
Immature Granulocytes: 1 %
LYMPHS ABS: 1.1 10*3/uL (ref 0.7–4.0)
LYMPHS PCT: 13 %
MCH: 28.6 pg (ref 26.0–34.0)
MCHC: 32 g/dL (ref 30.0–36.0)
MCV: 89.4 fL (ref 78.0–100.0)
MONO ABS: 0.7 10*3/uL (ref 0.1–1.0)
MONOS PCT: 8 %
Neutro Abs: 6 10*3/uL (ref 1.7–7.7)
Neutrophils Relative %: 75 %
Platelets: 233 10*3/uL (ref 150–400)
RBC: 3.6 MIL/uL — ABNORMAL LOW (ref 3.87–5.11)
RDW: 16.1 % — ABNORMAL HIGH (ref 11.5–15.5)
WBC: 8.1 10*3/uL (ref 4.0–10.5)

## 2017-09-09 LAB — RENAL FUNCTION PANEL
ALBUMIN: 2 g/dL — AB (ref 3.5–5.0)
Anion gap: 8 (ref 5–15)
BUN: 22 mg/dL — AB (ref 6–20)
CHLORIDE: 107 mmol/L (ref 98–111)
CO2: 23 mmol/L (ref 22–32)
Calcium: 8 mg/dL — ABNORMAL LOW (ref 8.9–10.3)
Creatinine, Ser: 3.1 mg/dL — ABNORMAL HIGH (ref 0.44–1.00)
GFR calc Af Amer: 20 mL/min — ABNORMAL LOW (ref >60)
GFR calc non Af Amer: 17 mL/min — ABNORMAL LOW (ref >60)
GLUCOSE: 89 mg/dL (ref 70–99)
PHOSPHORUS: 2.7 mg/dL (ref 2.5–4.6)
Potassium: 5.6 mmol/L — ABNORMAL HIGH (ref 3.5–5.1)
Sodium: 138 mmol/L (ref 135–145)

## 2017-09-09 LAB — GLUCOSE, CAPILLARY
GLUCOSE-CAPILLARY: 117 mg/dL — AB (ref 70–99)
GLUCOSE-CAPILLARY: 117 mg/dL — AB (ref 70–99)
Glucose-Capillary: 76 mg/dL (ref 70–99)
Glucose-Capillary: 83 mg/dL (ref 70–99)
Glucose-Capillary: 86 mg/dL (ref 70–99)
Glucose-Capillary: 91 mg/dL (ref 70–99)

## 2017-09-09 LAB — URINE CULTURE: CULTURE: NO GROWTH

## 2017-09-09 MED ORDER — ENSURE ENLIVE PO LIQD
237.0000 mL | Freq: Three times a day (TID) | ORAL | Status: DC
  Administered 2017-09-09 – 2017-09-10 (×3): 237 mL via ORAL

## 2017-09-09 MED ORDER — SODIUM CHLORIDE 0.9 % IV SOLN
INTRAVENOUS | Status: DC
  Administered 2017-09-09 – 2017-09-11 (×2): via INTRAVENOUS

## 2017-09-09 MED ORDER — PIPERACILLIN-TAZOBACTAM IN DEX 2-0.25 GM/50ML IV SOLN
2.2500 g | Freq: Three times a day (TID) | INTRAVENOUS | Status: DC
  Administered 2017-09-09: 2.25 g via INTRAVENOUS
  Filled 2017-09-09 (×2): qty 50

## 2017-09-09 NOTE — Progress Notes (Addendum)
Pharmacy Antibiotic Note  Bailey Alexander is a 47 y.o. female admitted on 09/05/2017 with AMS and hyponatemia, leukocytosis and possible sepsis  Pharmacy has been consulted for Zosyn dosing.    Currently, antibiotic day #6.  Patient in AKI: SCr 0.98>>1.65>>3.10 over last 3 days & K up 5.6.  UOsm 140 (low), FeNA 11.5% (high) UOP recorded as 1 mL/kg/hr. Est CrCl ~20-3225mL/min.   Plan: Reduce Zosyn to 2.25g IV q8h. Follow-up renal function and need to further adjust dosing.  Follow-up length of therapy (stop after today's doses?).  No other medications require adjustment currently.     Height: 5\' 4"  (162.6 cm) Weight: 165 lb 5.5 oz (75 kg) IBW/kg (Calculated) : 54.7  Temp (24hrs), Avg:98.9 F (37.2 C), Min:98.3 F (36.8 C), Max:99.3 F (37.4 C)  Recent Labs  Lab 09/05/17 0145 09/05/17 0709  09/06/17 0328  09/06/17 2303 09/07/17 0326 09/07/17 1543 09/08/17 0333 09/09/17 0534  WBC  --  9.6  --  12.3*  --   --  12.2*  --  9.3 8.1  CREATININE  --  0.44   < > 0.81   < > 0.86 0.78 0.98 1.65* 3.10*  LATICACIDVEN 1.06  --   --   --   --   --   --   --   --   --    < > = values in this interval not displayed.    Estimated Creatinine Clearance: 22.7 mL/min (A) (by C-G formula based on SCr of 3.1 mg/dL (H)).    Allergies  Allergen Reactions  . Abilify [Aripiprazole]    Link SnufferJessica Carsten Carstarphen, PharmD, BCPS, BCCCP Clinical Pharmacist Clinical phone 09/09/2017 until 3:30PM [phone] After hours, please call #[phone] 09/09/2017 9:03 AM

## 2017-09-09 NOTE — Progress Notes (Signed)
PROGRESS NOTE  Bailey Alexander ZOX:[id number] DOB: December 04, 1970 DOA: 09/05/2017 PCP: Bernerd Limbo, MD  HPI/Recap of past 24 hours: 47 y.o. Caucasian female, nursing home resident transferred to [facility] Emergency Department from [facility] for further evaluation and management of severe hyponatremia and hypokalemia. She presented to [facility] ED after fall with altered mental status. She was found to have several metabolic derangements including hyponatremia (111), hypokalemia, hypomagnesemia with hypotension. Pt was [facility] to [facility] for [facility] admission and nephrology consultation. In the ICU pt was placed on pressors and currently weaned off. TRH assumed care on 09/08/17.   Today, pt reported feeling better, denies any new complaints. RN reported poor food intake (likely due to no teeth/dentures, also reported being a picky eater). Pt able to drink Ensure. Dietician upgraded diet to regular, to increase selection.  Assessment/Plan: Principal Problem:   Hyponatremia Active Problems:   Malnutrition (HCC)   Acquired hypothyroidism   Hypokalemia   Hypomagnesemia   Cardiac enzymes elevated   Pulmonary nodule  Hypovolemic hyponatremia Currently Na WNL Hx of psychogenic polydipsia (restrict free water) Nephrology on board: Initially thought to be overcorrected and was placed on D5/1/2 NS, currently d/c and placed on gentle hydration with NS 50cc Monitor closely, daily renal panel  Hypotension, ??sepsis of unknown origin BP currently stable Afebrile, resolved leukocytosis, currently off pressors, BP soft, but stable BC X 2 NGTD UC grew 30,000 viridans strep, repeat showed no growth CXR no acute abnormality Continue IV hydration S/P IV Zosyn for a total of 5 days Monitor closely  AKI Cr 1.65--> 3.10, baseline WNL Likely ATN from hypotension Nephrology on board, apprec recs Continue IVF Daily renal panel  Electrolyte  imbalance Hypokalemia/hypocalcemia/hypophosphatemia/hypomagnesemia Resolving ??Re-feeding syndrome Nephrology on board Nutrition on board Replace prn  Mild hyperkalemia Daily renal panel, monitor closely  Hypothyroidism Continue Synthroid  Prolonged QTc Intermittent EKG    Code Status: Full  Family Communication: None at bedside  Disposition Plan: SNF   Consultants:  Nephrology  PCCM  Procedures:  None  Antimicrobials:  Completed 5 days of Zosyn  DVT prophylaxis:  Heparin [place]   Objective: Vitals:   09/09/17 1200 09/09/17 1300 09/09/17 1400 09/09/17 1500  BP:      Pulse: 84 86 76 75  Resp: (!) 22 19 (!) 21 20  Temp:      TempSrc:      SpO2: 95% 100% 100% 100%  Weight:      Height:        Intake/Output Summary (Last 24 hours) at 09/09/2017 1600 Last data filed at 09/09/2017 1500 Gross per 24 hour  Intake 1517.92 ml  Output 1531 ml  Net -13.08 ml   Filed Weights   09/07/17 0500 09/08/17 0500 09/09/17 0500  Weight: 69.4 kg 72.5 kg 75 kg    Exam:   General: NAD  Cardiovascular: S1, S2 present   Respiratory: CTAB  Abdomen: Soft, NT, ND, BS present   Musculoskeletal: No pedal edema bilaterally  Skin: Normal  Psychiatry: Normal mood   Data Reviewed: CBC: Recent Labs  Lab 09/05/17 0709 09/05/17 0710 09/06/17 0328 09/07/17 0326 09/08/17 0333 09/09/17 0534  WBC 9.6  --  12.3* 12.2* 9.3 8.1  NEUTROABS  --   --   --   --   --  6.0  HGB 10.7* 15.3* 12.2 12.1 10.0* 10.3*  HCT 30.7* 45.0 36.0 36.7 31.6* 32.2*  MCV 83.7  --  85.7 87.6 90.5 89.4  PLT 276  --  319 300 225 233   Basic Metabolic Panel: Recent Labs  Lab 09/05/17 0138 09/05/17 0709  09/06/17 0328  09/06/17 2303 09/07/17 0326 09/07/17 1543 09/08/17 0333 09/09/17 0534  NA 111* 124*   < > 137   < > 139 141 140 135 138  K 1.9* 3.4*   < > 4.2   < > 3.8 4.1 4.4 3.7 5.6*  CL 81* 90*   < > 107   < > 110 112* 111 106 107  CO2 21* 19*   < > 22   < > 23 21* 20* 22 23   GLUCOSE 110* 143*   < > 99   < > 112* 115* 113* 113* 89  BUN <5* <5*   < > <5*   < > <5* <5* 6 10 22*  CREATININE 0.35* 0.44   < > 0.81   < > 0.86 0.78 0.98 1.65* 3.10*  CALCIUM 6.7* 6.7*   < > 7.8*   < > 7.6* 7.9* 7.6* 7.5* 8.0*  MG 1.2* 1.9  --  2.5*  --   --  1.8  --  1.6*  --   PHOS  --  2.1*  --  1.9*  --   --  2.9  --  2.0* 2.7   < > = values in this interval not displayed.   GFR: Estimated Creatinine Clearance: 22.7 mL/min (A) (by C-G formula based on SCr of 3.1 mg/dL (H)). Liver Function Tests: Recent Labs  Lab 09/05/17 0020 09/05/17 0709 09/09/17 0534  AST 71* 54*  --   ALT 37 24  --   ALKPHOS 80 70  --   BILITOT 1.8* 2.0*  --   PROT 6.1* 4.7*  --   ALBUMIN 3.4* 2.6* 2.0*   Recent Labs  Lab 09/05/17 0035  LIPASE 21   Recent Labs  Lab 09/05/17 0048  AMMONIA 11   Coagulation Profile: Recent Labs  Lab 09/05/17 0035  INR 1.01   Cardiac Enzymes: Recent Labs  Lab 09/05/17 1413 09/05/17 2027 09/06/17 1038 09/06/17 1437 09/06/17 2303  TROPONINI 0.45* 0.52* 0.27* 0.26* 0.22*   BNP (last 3 results) No results for input(s): PROBNP in the last 8760 hours. HbA1C: No results for input(s): HGBA1C in the last 72 hours. CBG: Recent Labs  Lab 09/08/17 2011 09/09/17 0015 09/09/17 0335 09/09/17 0743 09/09/17 1307  GLUCAP 104* 83 86 76 91   Lipid Profile: No results for input(s): CHOL, HDL, LDLCALC, TRIG, CHOLHDL, LDLDIRECT in the last 72 hours. Thyroid Function Tests: No results for input(s): TSH, T4TOTAL, FREET4, T3FREE, THYROIDAB in the last 72 hours. Anemia Panel: No results for input(s): VITAMINB12, FOLATE, FERRITIN, TIBC, IRON, RETICCTPCT in the last 72 hours. Urine analysis:    Component Value Date/Time   COLORURINE STRAW (A) 09/08/2017 0802   APPEARANCEUR CLEAR 09/08/2017 0802   LABSPEC 1.003 (L) 09/08/2017 0802   PHURINE 7.0 09/08/2017 0802   GLUCOSEU NEGATIVE 09/08/2017 0802   HGBUR SMALL (A) 09/08/2017 0802   BILIRUBINUR NEGATIVE  09/08/2017 0802   KETONESUR NEGATIVE 09/08/2017 0802   PROTEINUR NEGATIVE 09/08/2017 0802   NITRITE NEGATIVE 09/08/2017 0802   LEUKOCYTESUR MODERATE (A) 09/08/2017 0802   Sepsis Labs: @LABRCNTIP (procalcitonin:4,lacticidven:4)  ) Recent Results (from the past 240 hour(s))  Urine Culture     Status: Abnormal   Collection Time: 09/04/17 10:27 AM  Result Value Ref Range Status   Specimen Description   Final    URINE, CLEAN CATCH Performed at [facility], [address] [place], [place] [address]    Special Requests   Final    NONE Performed at [facility], [address] [place], [place] [address]    Culture 30,000 COLONIES/mL VIRIDANS STREPTOCOCCUS (A)  Final   Report Status 09/07/2017 FINAL  Final  Blood culture (routine x 2)     Status: None (Preliminary result)   Collection Time: 09/05/17  2:27 AM  Result Value Ref Range Status   Specimen Description LEFT ANTECUBITAL  Final   Special Requests   Final    BOTTLES DRAWN AEROBIC AND ANAEROBIC Blood Culture adequate volume   Culture   Final    NO GROWTH 4 DAYS Performed at [facility], [address] [place], [place] [address]    Report Status PENDING  Incomplete  Blood culture (routine x 2)     Status: None (Preliminary result)   Collection Time: 09/05/17  2:36 AM  Result Value Ref Range Status   Specimen Description BLOOD LEFT HAND  Final   Special Requests   Final    BOTTLES DRAWN AEROBIC ONLY Blood Culture results may not be optimal due to an inadequate volume of blood received in culture bottles   Culture   Final    NO GROWTH 4 DAYS Performed at [facility], [address] [place], [place] [address]    Report Status PENDING  Incomplete  MRSA PCR Screening     Status: None   Collection Time: 09/05/17  8:54 AM  Result Value Ref Range Status   MRSA by PCR NEGATIVE NEGATIVE Final    Comment:        The GeneXpert MRSA Assay (FDA approved for NASAL specimens only), is one component of a comprehensive MRSA  colonization surveillance program. It is not intended to diagnose MRSA infection nor to guide or monitor treatment for MRSA infections. Performed at [facility], [address] [place], [place] [address]   Urine Culture     Status: None   Collection Time: 09/08/17  3:36 PM  Result Value Ref Range Status   Specimen Description URINE, CLEAN CATCH  Final   Special Requests   Final    NONE Performed at [facility], [address] [place], [place] [address]    Culture NO GROWTH  Final   Report Status 09/09/2017 FINAL  Final      Studies: No results found.  Scheduled Meds: . feeding supplement (ENSURE ENLIVE)  237 mL Oral TID BM  . folic acid  1 mg Oral Daily  . heparin injection (subcutaneous)  5,000 Units Subcutaneous Q8H  . insulin aspart  0-15 Units Subcutaneous Q4H  . levothyroxine  50 mcg Oral QAC breakfast  . multivitamin with minerals  1 tablet Oral Daily  . thiamine  100 mg Oral Daily    Continuous Infusions: . sodium chloride 250 mL (09/08/17 1048)  . sodium chloride    . phenylephrine (NEO-SYNEPHRINE) Adult infusion Stopped (09/07/17 [id number])     LOS: 4 days     Briant CedarNkeiruka J Ezenduka, MD [facility]   If 7PM-7AM, please contact night-coverage [email or web address] Password [email or web address] 09/09/2017, 4:00 PM

## 2017-09-09 NOTE — Progress Notes (Addendum)
Subjective: Interval History: no complaints, oriented to self and place not time- unfortunately her renal failure has progressed- U/A c/w hemodynamic injury as we suspected - nonoliguric   Objective: Vital signs in last 24 hours: Temp:  [98 F (36.7 C)-99.3 F (37.4 C)] 98.9 F (37.2 C) (08/22 0334) Pulse Rate:  [79-107] 79 (08/22 0700) Resp:  [18-30] 21 (08/22 0700) BP: (98-128)/(53-78) 108/70 (08/22 0400) SpO2:  [92 %-100 %] 98 % (08/22 0700) Weight:  [75 kg] 75 kg (08/22 0500) Weight change: 2.5 kg  Intake/Output from previous day: 08/21 0701 - 08/22 0700 In: 1980.5 [P.O.:474; I.V.:1064.5; IV Piggyback:442.1] Out: 1759 [Urine:1755; Stool:4] Intake/Output this shift: No intake/output data recorded.  Cardiac: normal rate and rhythm, clear s1 and s2 Pulmonary: CTAB, not in distress Abdominal: non distended abdomen, soft and nontender Extremities: no LE  edema Mental status: Alert and oriented x3  Lab Results: Recent Labs    09/08/17 0333 09/09/17 0534  WBC 9.3 8.1  HGB 10.0* 10.3*  HCT 31.6* 32.2*  PLT 225 233   BMET:  Recent Labs    09/08/17 0333 09/09/17 0534  NA 135 138  K 3.7 5.6*  CL 106 107  CO2 22 23  GLUCOSE 113* 89  BUN 10 22*  CREATININE 1.65* 3.10*  CALCIUM 7.5* 8.0*   No results for input(s): PTH in the last 72 hours. Iron Studies: No results for input(s): IRON, TIBC, TRANSFERRIN, FERRITIN in the last 72 hours. CBG (last 3)  Recent Labs    09/09/17 0015 09/09/17 0335 09/09/17 0743  GLUCAP 83 86 76     Studies/Results: No results found.  I have reviewed the patient's current medications.  Assessment/Plan: Hypovolemic Hyponatremia: This is likely a sequale of poor oral intake of osms, pt also has psychogenic polydipsia. Also worsened by low bp. Has resolved with fluids.   -Na stable at 138 this am initial rapid correction on admission.  Stable since no longer polyuric -50cc/hr NS   AKI: prerenal vs ATN from hypotension: neo d/ced  8/20, cr up to 3.10, BP still stable    -will keep 50cc ns going in background -d/c foley today -primary please d/c zosyn if possible not ideal for AKI and do not feel the patient is infected   Hypotension: believe this to be from volume loss. Taken off neo 8/20 and remained hypotensive in the morning but later was able to maintain her own bp   -low normal bp's no longer requiring fluid resuscitation   Hyperkalemia: mild in setting of renal injury and potassium replacement/diet  -NS will wash K out, continue to monitor for now   Hypocalcemia:  normal today   -continue to monitor   Prolonged QT: mag, k and calcium now replete   -continue to monitor   Hypophosphatemia: normal this am continue to replete PRN   Hypothyroidism: TSH wnl    Malnutrition: per primary, nutrition team involved, continue replacing phos              LOS: 4 days   Thornell MuleBrandon Winfrey 09/09/2017,7:56 AM   Patient seen and examined, agree with above note with above modifications. Initial hyponatremia which has resolved, now has Sufferred ATN from low BP- crt still rising but nonoliguric so I am hoping will recover soon.  Sodium stable just on a little NS.  K is high in the setting of it getting repleted yesterday - no specific treatment needed- feel it will trend down   Olamide Carattini A

## 2017-09-09 NOTE — Progress Notes (Signed)
  Nutrition Follow-up  DOCUMENTATION CODES:   Not applicable  INTERVENTION:   Upgrade diet to REGULAR with chopped meats to allow pt to have cereal, grilled cheese sandwiches to promote po intake. RD took pt preferences and plan to order meals. Discussed with RN  Increase Ensure Enlive po BID, each supplement provides 350 kcal and 20 grams of protein  Add Magic cup TID with meals, each supplement provides 290 kcal and 9 grams of protein  Continue MVI  Calorie Count to better assess po intake as limited documentation. If poor oral intake continues, recommend insertion of Cortrak tube with initiation of tube feeding   NUTRITION DIAGNOSIS:   Inadequate oral intake related to poor appetite, lethargy/confusion, acute illness, chronic illness as evidenced by per patient/family report, NPO status.  Progressing  GOAL:   Patient will meet greater than or equal to 90% of their needs  Not Met  MONITOR:   PO intake, Labs, Weight trends, Supplement acceptance  REASON FOR ASSESSMENT:   Malnutrition Screening Tool    ASSESSMENT:   47 yo female admitted with hypovolemic hyponatremia in setting of psychogenic polydipsia and poor oral intake' pt also with hypokalemia, hypomagnesemia, hypophosphatemia and hypotension. Pt has been residing in nursing home since January 2018. Pt with hx of ADEK deficiencies and hx of bulimia, anorexia and EtOH abuse. Pt is wheelchair bound  Pt is not eating. Recorded po intake 5% of meals. Per RN, this is an exaggeration. Pt ate 1 bite this AM of breakfast. Pt ate 3 bites of yogurt all day yesterday, nothing else. Pt is drinking fluids well and taking Ensure BID.  Pt is edentulous so this RD downgraded diet to Dysphagia III (mechanical soft) upon initial assessment. However, pt is a very picky eater and wants cereal for breakfast and grilled cheese sandwiches for soup for lunch. In order to allow this, plan to upgrade diet consistency to Regular Pt reports  she likes Ice Cream but not all the time, too cold. Plan to order Magic Cup on meal trays  Kidney function worsening, hemodynamic injury per MD, nonoliguric. UOP 1.75 L in previous 24 hours.  Hyperkalemic this AM, pt received 1 time dose of potassium yesterday.   Pt with 4 documented BMs yesterday  Labs: potassium 5.6, Creatinine 3.10, BUN 22, phosphorus wdl Meds: MVI, thiamine, folic acid, ss novolog   Diet Order:   Diet Order            Diet regular Room service appropriate? Yes; Fluid consistency: Thin  Diet effective now              EDUCATION NEEDS:   No education needs have been identified at this time  Skin:  Skin Assessment: Reviewed RN Assessment  Last BM:  8/22  Height:   Ht Readings from Last 1 Encounters:  09/05/17 '5\' 4"'$  (1.626 m)    Weight:   Wt Readings from Last 1 Encounters:  09/09/17 75 kg    Ideal Body Weight:     BMI:  Body mass index is 28.38 kg/m.  Estimated Nutritional Needs:   Kcal:  1750-1950 kcals   Protein:  85-95 g  Fluid:  per MD   Kerman Passey MS, RD, LDN, CNSC [phone] Pager  [phone] Weekend/On-Call Pager

## 2017-09-10 LAB — RENAL FUNCTION PANEL
ALBUMIN: 2.1 g/dL — AB (ref 3.5–5.0)
ANION GAP: 8 (ref 5–15)
BUN: 30 mg/dL — AB (ref 6–20)
CALCIUM: 8.3 mg/dL — AB (ref 8.9–10.3)
CO2: 22 mmol/L (ref 22–32)
Chloride: 109 mmol/L (ref 98–111)
Creatinine, Ser: 3.86 mg/dL — ABNORMAL HIGH (ref 0.44–1.00)
GFR calc Af Amer: 15 mL/min — ABNORMAL LOW (ref >60)
GFR, EST NON AFRICAN AMERICAN: 13 mL/min — AB (ref >60)
Glucose, Bld: 85 mg/dL (ref 70–99)
PHOSPHORUS: 3.7 mg/dL (ref 2.5–4.6)
POTASSIUM: 5.2 mmol/L — AB (ref 3.5–5.1)
SODIUM: 139 mmol/L (ref 135–145)

## 2017-09-10 LAB — CULTURE, BLOOD (ROUTINE X 2)
CULTURE: NO GROWTH
CULTURE: NO GROWTH
SPECIAL REQUESTS: ADEQUATE

## 2017-09-10 LAB — CBC WITH DIFFERENTIAL/PLATELET
Abs Immature Granulocytes: 0.1 10*3/uL (ref 0.0–0.1)
BASOS ABS: 0 10*3/uL (ref 0.0–0.1)
BASOS PCT: 0 %
EOS PCT: 4 %
Eosinophils Absolute: 0.3 10*3/uL (ref 0.0–0.7)
HCT: 31.5 % — ABNORMAL LOW (ref 36.0–46.0)
Hemoglobin: 10.1 g/dL — ABNORMAL LOW (ref 12.0–15.0)
Immature Granulocytes: 1 %
LYMPHS PCT: 14 %
Lymphs Abs: 1.3 10*3/uL (ref 0.7–4.0)
MCH: 29.4 pg (ref 26.0–34.0)
MCHC: 32.1 g/dL (ref 30.0–36.0)
MCV: 91.6 fL (ref 78.0–100.0)
Monocytes Absolute: 0.8 10*3/uL (ref 0.1–1.0)
Monocytes Relative: 9 %
NEUTROS ABS: 6.4 10*3/uL (ref 1.7–7.7)
Neutrophils Relative %: 72 %
PLATELETS: 256 10*3/uL (ref 150–400)
RBC: 3.44 MIL/uL — AB (ref 3.87–5.11)
RDW: 16.2 % — ABNORMAL HIGH (ref 11.5–15.5)
WBC: 8.8 10*3/uL (ref 4.0–10.5)

## 2017-09-10 LAB — GLUCOSE, CAPILLARY
GLUCOSE-CAPILLARY: 136 mg/dL — AB (ref 70–99)
GLUCOSE-CAPILLARY: 78 mg/dL (ref 70–99)
GLUCOSE-CAPILLARY: 79 mg/dL (ref 70–99)
GLUCOSE-CAPILLARY: 92 mg/dL (ref 70–99)
Glucose-Capillary: 76 mg/dL (ref 70–99)
Glucose-Capillary: 108 mg/dL — ABNORMAL HIGH (ref 70–99)

## 2017-09-10 MED ORDER — ENSURE ENLIVE PO LIQD
237.0000 mL | Freq: Every day | ORAL | Status: DC
  Administered 2017-09-10 – 2017-09-14 (×18): 237 mL via ORAL

## 2017-09-10 NOTE — Progress Notes (Addendum)
Subjective: Interval History: No complaints today,not SOB, no cp  Objective: Vital signs in last 24 hours: Temp:  [98.3 F (36.8 C)-99.1 F (37.3 C)] 98.6 F (37 C) (08/23 0400) Pulse Rate:  [74-96] 81 (08/23 0400) Resp:  [19-30] 22 (08/23 0400) BP: (116-131)/(29-75) 116/69 (08/23 0400) SpO2:  [94 %-100 %] 94 % (08/23 0400) Weight:  [73.8 kg] 73.8 kg (08/23 0500) Weight change: -1.2 kg  Intake/Output from previous day: 08/22 0701 - 08/23 0700 In: 1799.5 [P.O.:711; I.V.:986.8; IV Piggyback:101.7] Out: 1175 [Urine:1175] Intake/Output this shift: No intake/output data recorded.  Cardiac: normal rate and rhythm, clear s1 and s2 Pulmonary: CTAB, not in distress Abdominal: non distended abdomen, soft and nontender Extremities: no LE  edema Mental status: Alert and oriented x2  Lab Results: Recent Labs    09/09/17 0534 09/10/17 0259  WBC 8.1 8.8  HGB 10.3* 10.1*  HCT 32.2* 31.5*  PLT 233 256   BMET:  Recent Labs    09/09/17 0534 09/10/17 0259  NA 138 139  K 5.6* 5.2*  CL 107 109  CO2 23 22  GLUCOSE 89 85  BUN 22* 30*  CREATININE 3.10* 3.86*  CALCIUM 8.0* 8.3*   No results for input(s): PTH in the last 72 hours. Iron Studies: No results for input(s): IRON, TIBC, TRANSFERRIN, FERRITIN in the last 72 hours. CBG (last 3)  Recent Labs    09/09/17 2015 09/10/17 0023 09/10/17 0434  GLUCAP 117* 79 78     Studies/Results: No results found.  I have reviewed the patient's current medications.  Assessment/Plan: Hypovolemic Hyponatremia: This is likely a sequale of poor oral intake of osms, pt also has psychogenic polydipsia. Also worsened by low bp. Has resolved with fluids.   -Na stable at 139 this am, had initial rapid correction on admission.  Stable since no longer polyuric -50cc/hr NS   AKI: prerenal vs ATN from hypotension: neo d/ced 8/20, cr up to 3.86, BP still stable, zosyn off, still putting out urine 1200cc last 24hr.  I believe the Cr has peaked  and we just have to wait this out and see what kind of recovery we get.  -continue to avoid renal toxic meds  -will keep 50cc ns going in background  Hypotension: believe this to be from volume loss. Taken off neo 8/20 and remained hypotensive in the morning but later was able to maintain her own bp   -now resolved 120s/80 continue to monitor   Hyperkalemia: mild in setting of renal injury and potassium replacement/diet   -NS and time will wash K out, continue to monitor for now   Hypocalcemia:  normal today   -continue to monitor   Prolonged QT: mag, k and calcium now replete   -continue to monitor   Hypophosphatemia: normal this am continue to replete PRN   Hypothyroidism: TSH wnl    Malnutrition: per primary, nutrition team involved, continue replacing phos        Deconditioning: will order PT/OT today     LOS: 5 days   Thornell MuleBrandon Winfrey 09/10/2017,7:39 AM   Patient seen and examined, agree with above note with above modifications. Initial hyponatremia which has resolved, now has Sufferred ATN from low BP- crt still rising but nonoliguric so I am hoping will recover soon. Sodium stable just on a little NS. K is slightly high in the setting of it getting repleted previously - no specific treatment needed- feel it will trend down   Braylin Xu

## 2017-09-10 NOTE — Progress Notes (Signed)
CSW will continue to follow- per MD not stable today  Facility updated and can accept pt back over the wknd if stable  Burna SisJenna H. Kadir Azucena, LCSW Clinical Social Worker [phone]

## 2017-09-10 NOTE — Progress Notes (Signed)
PROGRESS NOTE  Gari Crownammy Bear NWG:[id number] DOB: Feb 16, 1970 DOA: 09/05/2017 PCP: Bernerd LimboAriza, Fernando Enrique, MD  HPI/Recap of past 24 hours: 47 y.o. Caucasian female, nursing home resident transferred to [facility] Emergency Department from [facility] for further evaluation and management of severe hyponatremia and hypokalemia. She [facility] to [facility] after fall with altered mental status. She was found to have several metabolic derangements including hyponatremia (111), hypokalemia, hypomagnesemia with hypotension. [facility] and nephrology consultation. In the ICU pt was placed on pressors and currently weaned off. [facility] assumed care on 09/08/17.   Today, pt reported feeling better, denies any new complaints. Still with poor food intake (likely due to no teeth/dentures, also reported being a picky eater, Hx of ?bulimia). Pt able to drink Ensure. Dietician working with patient  Assessment/Plan: Principal Problem:   Hyponatremia Active Problems:   Malnutrition (HCC)   Acquired hypothyroidism   Hypokalemia   Hypomagnesemia   Cardiac enzymes elevated   Pulmonary nodule  Hypovolemic hyponatremia Currently Na WNL Hx of psychogenic polydipsia (restrict free water) Monitor closely, daily renal panel  Hypotension, ??sepsis of unknown origin BP currently stable Afebrile, resolved leukocytosis, currently off pressors, BP soft, but stable BC X 2 NGTD UC grew 30,000 viridans strep, repeat showed no growth CXR no acute abnormality Continue IV hydration S/P IV Zosyn for a total of 5 days Monitor closely  AKI Cr 1.65--> 3.10-->3.86, baseline WNL Likely ATN from hypotension Nephrology on board, apprec recs Continue IVF Daily renal panel  Electrolyte imbalance Hypokalemia/hypocalcemia/hypophosphatemia/hypomagnesemia Resolving ??Re-feeding syndrome Nephrology on board Nutrition on board Replace prn  Mild  hyperkalemia Daily renal panel, monitor closely  Hypothyroidism Continue Synthroid  Prolonged QTc Intermittent EKG    Code Status: Full  Family Communication: None at bedside  Disposition Plan: Back to SNF   Consultants:  Nephrology  PCCM  Procedures:  None  Antimicrobials:  Completed 5 days of Zosyn  DVT prophylaxis:  Heparin [place]   Objective: Vitals:   09/10/17 0800 09/10/17 1100 09/10/17 1200 09/10/17 1533  BP: 121/72  135/89 140/77  Pulse: 73  83 86  Resp: 18  20 18   Temp:  98.4 F (36.9 C)  98.7 F (37.1 C)  TempSrc:  Oral  Oral  SpO2: 95%  98% 100%  Weight:    74 kg  Height:    5\' 4"  (1.626 m)    Intake/Output Summary (Last 24 hours) at 09/10/2017 1600 Last data filed at 09/10/2017 1200 Gross per 24 hour  Intake 1212.25 ml  Output 1000 ml  Net 212.25 ml   Filed Weights   09/09/17 0500 09/10/17 0500 09/10/17 1533  Weight: 75 kg 73.8 kg 74 kg    Exam:   General: NAD  Cardiovascular: S1, S2 present   Respiratory: CTAB  Abdomen: Soft, NT, ND, BS present, has rectal tube, draining liquid brown stool  Musculoskeletal: No pedal edema bilaterally  Skin: Normal  Psychiatry: Normal mood   Data Reviewed: CBC: Recent Labs  Lab 09/06/17 0328 09/07/17 0326 09/08/17 0333 09/09/17 0534 09/10/17 0259  WBC 12.3* 12.2* 9.3 8.1 8.8  NEUTROABS  --   --   --  6.0 6.4  HGB 12.2 12.1 10.0* 10.3* 10.1*  HCT 36.0 36.7 31.6* 32.2* 31.5*  MCV 85.7 87.6 90.5 89.4 91.6  PLT 319 300 225 233 256   Basic Metabolic Panel: Recent Labs  Lab 09/05/17 0138  09/05/17 0709  09/06/17 0328  09/07/17 0326  09/07/17 1543 09/08/17 0333 09/09/17 0534 09/10/17 0259  NA 111*  --  124*   < > 137   < > 141 140 135 138 139  K 1.9*  --  3.4*   < > 4.2   < > 4.1 4.4 3.7 5.6* 5.2*  CL 81*  --  90*   < > 107   < > 112* 111 106 107 109  CO2 21*  --  19*   < > 22   < > 21* 20* 22 23 22   GLUCOSE 110*  --  143*   < > 99   < > 115* 113* 113* 89 85  BUN <5*  --   <5*   < > <5*   < > <5* 6 10 22* 30*  CREATININE 0.35*  --  0.44   < > 0.81   < > 0.78 0.98 1.65* 3.10* 3.86*  CALCIUM 6.7*  --  6.7*   < > 7.8*   < > 7.9* 7.6* 7.5* 8.0* 8.3*  MG 1.2*  --  1.9  --  2.5*  --  1.8  --  1.6*  --   --   PHOS  --    < > 2.1*  --  1.9*  --  2.9  --  2.0* 2.7 3.7   < > = values in this interval not displayed.   GFR: Estimated Creatinine Clearance: 18.1 mL/min (A) (by C-G formula based on SCr of 3.86 mg/dL (H)). Liver Function Tests: Recent Labs  Lab 09/05/17 0020 09/05/17 0709 09/09/17 0534 09/10/17 0259  AST 71* 54*  --   --   ALT 37 24  --   --   ALKPHOS 80 70  --   --   BILITOT 1.8* 2.0*  --   --   PROT 6.1* 4.7*  --   --   ALBUMIN 3.4* 2.6* 2.0* 2.1*   Recent Labs  Lab 09/05/17 0035  LIPASE 21   Recent Labs  Lab 09/05/17 0048  AMMONIA 11   Coagulation Profile: Recent Labs  Lab 09/05/17 0035  INR 1.01   Cardiac Enzymes: Recent Labs  Lab 09/05/17 1413 09/05/17 2027 09/06/17 1038 09/06/17 1437 09/06/17 2303  TROPONINI 0.45* 0.52* 0.27* 0.26* 0.22*   BNP (last 3 results) No results for input(s): PROBNP in the last 8760 hours. HbA1C: No results for input(s): HGBA1C in the last 72 hours. CBG: Recent Labs  Lab 09/09/17 2015 09/10/17 0023 09/10/17 0434 09/10/17 0739 09/10/17 1130  GLUCAP 117* 79 78 76 92   Lipid Profile: No results for input(s): CHOL, HDL, LDLCALC, TRIG, CHOLHDL, LDLDIRECT in the last 72 hours. Thyroid Function Tests: No results for input(s): TSH, T4TOTAL, FREET4, T3FREE, THYROIDAB in the last 72 hours. Anemia Panel: No results for input(s): VITAMINB12, FOLATE, FERRITIN, TIBC, IRON, RETICCTPCT in the last 72 hours. Urine analysis:    Component Value Date/Time   COLORURINE STRAW (A) 09/08/2017 0802   APPEARANCEUR CLEAR 09/08/2017 0802   LABSPEC 1.003 (L) 09/08/2017 0802   PHURINE 7.0 09/08/2017 0802   GLUCOSEU NEGATIVE 09/08/2017 0802   HGBUR SMALL (A) 09/08/2017 0802   BILIRUBINUR NEGATIVE  09/08/2017 0802   KETONESUR NEGATIVE 09/08/2017 0802   PROTEINUR NEGATIVE 09/08/2017 0802   NITRITE NEGATIVE 09/08/2017 0802   LEUKOCYTESUR MODERATE (A) 09/08/2017 0802   Sepsis Labs: @LABRCNTIP (procalcitonin:4,lacticidven:4)  ) Recent Results (from the past 240 hour(s))  Urine Culture     Status: Abnormal   Collection Time: 09/04/17 10:27 AM  Result  Value Ref Range Status   Specimen Description   Final    URINE, CLEAN CATCH Performed at [facility], [address], [place], [place] [address]    Special Requests   Final    NONE Performed at [facility], [address] [place], [place] [address]    Culture 30,000 COLONIES/mL VIRIDANS STREPTOCOCCUS (A)  Final   Report Status 09/07/2017 FINAL  Final  Blood culture (routine x 2)     Status: None   Collection Time: 09/05/17  2:27 AM  Result Value Ref Range Status   Specimen Description LEFT ANTECUBITAL  Final   Special Requests   Final    BOTTLES DRAWN AEROBIC AND ANAEROBIC Blood Culture adequate volume   Culture   Final    NO GROWTH 5 DAYS Performed at [facility], [address], [place], [place] [address]    Report Status 09/10/2017 FINAL  Final  Blood culture (routine x 2)     Status: None   Collection Time: 09/05/17  2:36 AM  Result Value Ref Range Status   Specimen Description BLOOD LEFT HAND  Final   Special Requests   Final    BOTTLES DRAWN AEROBIC ONLY Blood Culture results may not be optimal due to an inadequate volume of blood received in culture bottles   Culture   Final    NO GROWTH 5 DAYS Performed at [facility], [address] [place], [place] [address]    Report Status 09/10/2017 FINAL  Final  MRSA PCR Screening     Status: None   Collection Time: 09/05/17  8:54 AM  Result Value Ref Range Status   MRSA by PCR NEGATIVE NEGATIVE Final    Comment:        The GeneXpert MRSA Assay (FDA approved for NASAL specimens only), is one component of a comprehensive MRSA colonization surveillance program. It is  not intended to diagnose MRSA infection nor to guide or monitor treatment for MRSA infections. Performed at [facility], [address] [place], [place] [address]   Urine Culture     Status: None   Collection Time: 09/08/17  3:36 PM  Result Value Ref Range Status   Specimen Description URINE, CLEAN CATCH  Final   Special Requests   Final    NONE Performed at [facility], [address] [place], [place] [address]    Culture NO GROWTH  Final   Report Status 09/09/2017 FINAL  Final      Studies: No results found.  Scheduled Meds: . feeding supplement (ENSURE ENLIVE)  237 mL Oral 5 X Daily  . folic acid  1 mg Oral Daily  . heparin injection (subcutaneous)  5,000 Units Subcutaneous Q8H  . insulin aspart  0-15 Units Subcutaneous Q4H  . levothyroxine  50 mcg Oral QAC breakfast  . multivitamin with minerals  1 tablet Oral Daily  . thiamine  100 mg Oral Daily    Continuous Infusions: . sodium chloride 250 mL (09/08/17 1048)  . sodium chloride 50 mL/hr at 09/10/17 1200     LOS: 5 days     Briant Cedar, MD [facility]   If 7PM-7AM, please contact night-coverage [email or web address] Password [facility] 09/10/2017, 4:00 PM

## 2017-09-10 NOTE — Progress Notes (Signed)
Received from #M, pt alert. Agree with previous RN's Assessment.

## 2017-09-10 NOTE — Progress Notes (Signed)
Nutrition Follow-up  DOCUMENTATION CODES:   Not applicable  INTERVENTION:   Ensure Enlive po 5 times daily, each supplement provides 350 kcal and 20 grams of protein  D/C calorie count  Continue MVI  NUTRITION DIAGNOSIS:   Inadequate oral intake related to poor appetite, lethargy/confusion, acute illness, chronic illness as evidenced by per patient/family report, NPO status.  GOAL:   Patient will meet greater than or equal to 90% of their needs  Not Met  MONITOR:   PO intake, Labs, Weight trends, Supplement acceptance  REASON FOR ASSESSMENT:   Malnutrition Screening Tool    ASSESSMENT:   47 yo female admitted with hypovolemic hyponatremia in setting of psychogenic polydipsia and poor oral intake' pt also with hypokalemia, hypomagnesemia, hypophosphatemia and hypotension. Pt has been residing in nursing home since January 2018. Pt with hx of ADEK deficiencies and hx of bulimia, anorexia and EtOH abuse. Pt is wheelchair bound  Pt awake, flat on visit. Reports she did not eat breakfast this AM because she was not hungry. RN reports pt would not eat Magic Cup. Pt reports she ate some of the grilled cheese yesterday but not the soup as she only likes vegetable and not the pudding. Unable to confirm that pt ate anything at all yesterday as no recorded po intake.   Pt will drink Ensure, plan to increase frequency to meet nutritional needs. Discussed with MD. RNs encouraging po intake  Labs: potassium 5.2, Creatinine 2.95, BUN  Meds: folic acid, MVI, thiamine  Diet Order:   Diet Order            Diet regular Room service appropriate? Yes; Fluid consistency: Thin  Diet effective now              EDUCATION NEEDS:   No education needs have been identified at this time  Skin:  Skin Assessment: Reviewed RN Assessment  Last BM:  8/22  Height:   Ht Readings from Last 1 Encounters:  09/05/17 _0  (1.626 m)    Weight:   Wt Readings from Last 1 Encounters:   09/10/17 73.8 kg    Ideal Body Weight:     BMI:  Body mass index is 27.93 kg/m.  Estimated Nutritional Needs:   Kcal:  1750-1950 kcals   Protein:  85-95 g  Fluid:  per MD  Kerman Passey MS, RD, LDN, CNSC [phone] Pager  [phone] Weekend/On-Call Pager

## 2017-09-11 LAB — GLUCOSE, CAPILLARY
GLUCOSE-CAPILLARY: 94 mg/dL (ref 70–99)
GLUCOSE-CAPILLARY: 123 mg/dL — AB (ref 70–99)
Glucose-Capillary: 106 mg/dL — ABNORMAL HIGH (ref 70–99)
Glucose-Capillary: 98 mg/dL (ref 70–99)
Glucose-Capillary: 119 mg/dL — ABNORMAL HIGH (ref 70–99)

## 2017-09-11 LAB — RENAL FUNCTION PANEL
ANION GAP: 10 (ref 5–15)
Albumin: 2 g/dL — ABNORMAL LOW (ref 3.5–5.0)
BUN: 37 mg/dL — ABNORMAL HIGH (ref 6–20)
CHLORIDE: 110 mmol/L (ref 98–111)
CO2: 22 mmol/L (ref 22–32)
CREATININE: 4.45 mg/dL — AB (ref 0.44–1.00)
Calcium: 8.8 mg/dL — ABNORMAL LOW (ref 8.9–10.3)
GFR calc non Af Amer: 11 mL/min — ABNORMAL LOW (ref >60)
GFR, EST AFRICAN AMERICAN: 13 mL/min — AB (ref >60)
GLUCOSE: 110 mg/dL — AB (ref 70–99)
Phosphorus: 4 mg/dL (ref 2.5–4.6)
Potassium: 4.4 mmol/L (ref 3.5–5.1)
Sodium: 142 mmol/L (ref 135–145)

## 2017-09-11 NOTE — Progress Notes (Signed)
Subjective:  Moved from ICU- otherwise no real change in status.  Unfortunately renal failure continues to progress- still making good urine  Objective Vital signs in last 24 hours: Vitals:   09/10/17 1736 09/10/17 2038 09/10/17 2300 09/11/17 0428  BP: 128/75 126/66 (!) 131/54 137/73  Pulse: 82 87 91 80  Resp:  18 18 18   Temp: 98.2 F (36.8 C) 98.1 F (36.7 C) 98.5 F (36.9 C) 99 F (37.2 C)  TempSrc: Oral Oral Oral Oral  SpO2: 99% 97% 94% 97%  Weight:    72.9 kg  Height:       Weight change: 0.2 kg  Intake/Output Summary (Last 24 hours) at 09/11/2017 1610 Last data filed at 09/11/2017 0545 Gross per 24 hour  Intake 1233.83 ml  Output 802 ml  Net 431.83 ml    Assessment/ Plan: Pt is a 47 y.o. yo female at SNF who was admitted on 09/05/2017 with significant hyponatremia in setting of polydipsia - hosp c/b AKI Assessment/Plan: 1. Hyponatremia- corrected- at one point was having polyuria 2. AKI- in the setting of hypotension that was pressor requiring in the setting of polyuria during recovery of hyponatremia- hemodynamics have recovered but renal function slow to come around.  Baseline renal function normal- nonoliguric- awaiting plateau and recovery- U/A is not active, no indications for dialysis  3. Anemia- not a major issue 4. HTN/volume- previously pressor dependent thought due to hypovolemia- now corrected- will stop IVF  Yanixan Mellinger A    Labs: Basic Metabolic Panel: Recent Labs  Lab 09/09/17 0534 09/10/17 0259 09/11/17 0442  NA 138 139 142  K 5.6* 5.2* 4.4  CL 107 109 110  CO2 23 22 22   GLUCOSE 89 85 110*  BUN 22* 30* 37*  CREATININE 3.10* 3.86* 4.45*  CALCIUM 8.0* 8.3* 8.8*  PHOS 2.7 3.7 4.0   Liver Function Tests: Recent Labs  Lab 09/05/17 0020 09/05/17 0709 09/09/17 0534 09/10/17 0259 09/11/17 0442  AST 71* 54*  --   --   --   ALT 37 24  --   --   --   ALKPHOS 80 70  --   --   --   BILITOT 1.8* 2.0*  --   --   --   PROT 6.1* 4.7*  --    --   --   ALBUMIN 3.4* 2.6* 2.0* 2.1* 2.0*   Recent Labs  Lab 09/05/17 0035  LIPASE 21   Recent Labs  Lab 09/05/17 0048  AMMONIA 11   CBC: Recent Labs  Lab 09/06/17 0328 09/07/17 0326 09/08/17 0333 09/09/17 0534 09/10/17 0259  WBC 12.3* 12.2* 9.3 8.1 8.8  NEUTROABS  --   --   --  6.0 6.4  HGB 12.2 12.1 10.0* 10.3* 10.1*  HCT 36.0 36.7 31.6* 32.2* 31.5*  MCV 85.7 87.6 90.5 89.4 91.6  PLT 319 300 225 233 256   Cardiac Enzymes: Recent Labs  Lab 09/05/17 1413 09/05/17 2027 09/06/17 1038 09/06/17 1437 09/06/17 2303  TROPONINI 0.45* 0.52* 0.27* 0.26* 0.22*   CBG: Recent Labs  Lab 09/10/17 1726 09/10/17 2058 09/11/17 0117 09/11/17 0426 09/11/17 0718  GLUCAP 136* 108* 106* 98 94    Iron Studies: No results for input(s): IRON, TIBC, TRANSFERRIN, FERRITIN in the last 72 hours. Studies/Results: No results found. Medications: Infusions: . sodium chloride 250 mL (09/08/17 1048)  . sodium chloride 50 mL/hr at 09/11/17 9604    Scheduled Medications: . feeding supplement (ENSURE ENLIVE)  237 mL Oral 5 X Daily  .  folic acid  1 mg Oral Daily  . heparin injection (subcutaneous)  5,000 Units Subcutaneous Q8H  . insulin aspart  0-15 Units Subcutaneous Q4H  . levothyroxine  50 mcg Oral QAC breakfast  . multivitamin with minerals  1 tablet Oral Daily  . thiamine  100 mg Oral Daily    have reviewed scheduled and prn medications.  Physical Exam: General: NAD Heart: RRR Lungs: mostly clear Abdomen: soft, non tender Extremities: no edema    09/11/2017,9:03 AM  LOS: 6 days

## 2017-09-11 NOTE — Progress Notes (Addendum)
PROGRESS NOTE  Bailey Alexander WUJ:[id number] DOB: April 18, 1970 DOA: 09/05/2017 PCP: Bernerd Limbo, MD  HPI/Recap of past 24 hours: 47 y.o. Caucasian female, nursing home resident transferred to [facility] Emergency Department from [facility] for further evaluation and management of severe hyponatremia and hypokalemia. She presented to [facility] ED after fall with altered mental status. She was found to have several metabolic derangements including hyponatremia (111), hypokalemia, hypomagnesemia with hypotension. Pt was transferred to [facility] for ICU admission and nephrology consultation. In the ICU pt was placed on pressors and currently weaned off. [facility] assumed care on 09/08/17.   Today, pt reported feeling better, denies any new complaints.  Assessment/Plan: Principal Problem:   Hyponatremia Active Problems:   Malnutrition (HCC)   Acquired hypothyroidism   Hypokalemia   Hypomagnesemia   Cardiac enzymes elevated   Pulmonary nodule  Hypovolemic hyponatremia Currently Na WNL Hx of psychogenic polydipsia (restrict free water) Monitor closely, daily renal panel  Hypotension, ??sepsis of unknown origin BP currently stable Afebrile, resolved leukocytosis, currently off pressors, BP soft, but stable BC X 2 NGTD UC grew 30,000 viridans strep, repeat showed no growth CXR no acute abnormality D/C IV hydration S/P IV Zosyn for a total of 5 days Monitor closely  AKI Cr 1.65--> 3.10-->3.86-->4.45 baseline WNL Likely ATN from hypotension Nephrology on board, apprec recs D/C IVF as per Nephrology Daily renal panel  Electrolyte imbalance Hypokalemia/hypocalcemia/hypophosphatemia/hypomagnesemia Resolving ??Re-feeding syndrome Nephrology on board Nutrition on board Replace prn  Mild hyperkalemia Daily renal panel, monitor closely  Hypothyroidism Continue Synthroid  Prolonged QTc Intermittent EKG    Code Status: Full  Family  Communication: None at bedside  Disposition Plan: Back to SNF   Consultants:  Nephrology  PCCM  Procedures:  None  Antimicrobials:  Completed 5 days of Zosyn  DVT prophylaxis:  Heparin [place]   Objective: Vitals:   09/10/17 2038 09/10/17 2300 09/11/17 0428 09/11/17 1236  BP: 126/66 (!) 131/54 137/73 (!) 146/78  Pulse: 87 91 80 86  Resp: 18 18 18 20   Temp: 98.1 F (36.7 C) 98.5 F (36.9 C) 99 F (37.2 C) 99.1 F (37.3 C)  TempSrc: Oral Oral Oral Oral  SpO2: 97% 94% 97% 98%  Weight:   72.9 kg   Height:        Intake/Output Summary (Last 24 hours) at 09/11/2017 1406 Last data filed at 09/11/2017 1235 Gross per 24 hour  Intake 1794.17 ml  Output 1402 ml  Net 392.17 ml   Filed Weights   09/10/17 0500 09/10/17 1533 09/11/17 0428  Weight: 73.8 kg 74 kg 72.9 kg    Exam:   General: NAD  Cardiovascular: S1, S2 present   Respiratory: CTAB  Abdomen: Soft, NT, ND, BS present  Musculoskeletal: No pedal edema bilaterally  Skin: Normal  Psychiatry: Normal mood   Data Reviewed: CBC: Recent Labs  Lab 09/06/17 0328 09/07/17 0326 09/08/17 0333 09/09/17 0534 09/10/17 0259  WBC 12.3* 12.2* 9.3 8.1 8.8  NEUTROABS  --   --   --  6.0 6.4  HGB 12.2 12.1 10.0* 10.3* 10.1*  HCT 36.0 36.7 31.6* 32.2* 31.5*  MCV 85.7 87.6 90.5 89.4 91.6  PLT 319 300 225 233 256   Basic Metabolic Panel: Recent Labs  Lab 09/05/17 0138  09/05/17 0709  09/06/17 0328  09/07/17 0326 09/07/17 1543 09/08/17 0333 09/09/17 0534 09/10/17 0259 09/11/17 0442  NA 111*  --  124*   < > 137   < > 141  140 135 138 139 142  K 1.9*  --  3.4*   < > 4.2   < > 4.1 4.4 3.7 5.6* 5.2* 4.4  CL 81*  --  90*   < > 107   < > 112* 111 106 107 109 110  CO2 21*  --  19*   < > 22   < > 21* 20* 22 23 22 22   GLUCOSE 110*  --  143*   < > 99   < > 115* 113* 113* 89 85 110*  BUN <5*  --  <5*   < > <5*   < > <5* 6 10 22* 30* 37*  CREATININE 0.35*  --  0.44   < > 0.81   < > 0.78 0.98 1.65* 3.10* 3.86* 4.45*   CALCIUM 6.7*  --  6.7*   < > 7.8*   < > 7.9* 7.6* 7.5* 8.0* 8.3* 8.8*  MG 1.2*  --  1.9  --  2.5*  --  1.8  --  1.6*  --   --   --   PHOS  --    < > 2.1*  --  1.9*  --  2.9  --  2.0* 2.7 3.7 4.0   < > = values in this interval not displayed.   GFR: Estimated Creatinine Clearance: 15.6 mL/min (A) (by C-G formula based on SCr of 4.45 mg/dL (H)). Liver Function Tests: Recent Labs  Lab 09/05/17 0020 09/05/17 0709 09/09/17 0534 09/10/17 0259 09/11/17 0442  AST 71* 54*  --   --   --   ALT 37 24  --   --   --   ALKPHOS 80 70  --   --   --   BILITOT 1.8* 2.0*  --   --   --   PROT 6.1* 4.7*  --   --   --   ALBUMIN 3.4* 2.6* 2.0* 2.1* 2.0*   Recent Labs  Lab 09/05/17 0035  LIPASE 21   Recent Labs  Lab 09/05/17 0048  AMMONIA 11   Coagulation Profile: Recent Labs  Lab 09/05/17 0035  INR 1.01   Cardiac Enzymes: Recent Labs  Lab 09/05/17 1413 09/05/17 2027 09/06/17 1038 09/06/17 1437 09/06/17 2303  TROPONINI 0.45* 0.52* 0.27* 0.26* 0.22*   BNP (last 3 results) No results for input(s): PROBNP in the last 8760 hours. HbA1C: No results for input(s): HGBA1C in the last 72 hours. CBG: Recent Labs  Lab 09/10/17 2058 09/11/17 0117 09/11/17 0426 09/11/17 0718 09/11/17 1214  GLUCAP 108* 106* 98 94 123*   Lipid Profile: No results for input(s): CHOL, HDL, LDLCALC, TRIG, CHOLHDL, LDLDIRECT in the last 72 hours. Thyroid Function Tests: No results for input(s): TSH, T4TOTAL, FREET4, T3FREE, THYROIDAB in the last 72 hours. Anemia Panel: No results for input(s): VITAMINB12, FOLATE, FERRITIN, TIBC, IRON, RETICCTPCT in the last 72 hours. Urine analysis:    Component Value Date/Time   COLORURINE STRAW (A) 09/08/2017 0802   APPEARANCEUR CLEAR 09/08/2017 0802   LABSPEC 1.003 (L) 09/08/2017 0802   PHURINE 7.0 09/08/2017 0802   GLUCOSEU NEGATIVE 09/08/2017 0802   HGBUR SMALL (A) 09/08/2017 0802   BILIRUBINUR NEGATIVE 09/08/2017 0802   KETONESUR NEGATIVE 09/08/2017 0802    PROTEINUR NEGATIVE 09/08/2017 0802   NITRITE NEGATIVE 09/08/2017 0802   LEUKOCYTESUR MODERATE (A) 09/08/2017 0802   Sepsis Labs: @LABRCNTIP (procalcitonin:4,lacticidven:4)  ) Recent Results (from the past 240 hour(s))  Urine Culture     Status: Abnormal   Collection Time:  09/04/17 10:27 AM  Result Value Ref Range Status   Specimen Description   Final    URINE, CLEAN CATCH Performed at [facility], [address] [place], [place] [address]    Special Requests   Final    NONE Performed at [facility], [address] [place], [place] [address]    Culture 30,000 COLONIES/mL VIRIDANS STREPTOCOCCUS (A)  Final   Report Status 09/07/2017 FINAL  Final  Blood culture (routine x 2)     Status: None   Collection Time: 09/05/17  2:27 AM  Result Value Ref Range Status   Specimen Description LEFT ANTECUBITAL  Final   Special Requests   Final    BOTTLES DRAWN AEROBIC AND ANAEROBIC Blood Culture adequate volume   Culture   Final    NO GROWTH 5 DAYS Performed at [facility], [address] [place], [place] [address]    Report Status 09/10/2017 FINAL  Final  Blood culture (routine x 2)     Status: None   Collection Time: 09/05/17  2:36 AM  Result Value Ref Range Status   Specimen Description BLOOD LEFT HAND  Final   Special Requests   Final    BOTTLES DRAWN AEROBIC ONLY Blood Culture results may not be optimal due to an inadequate volume of blood received in culture bottles   Culture   Final    NO GROWTH 5 DAYS Performed at [facility], [address], [place], [place] [address]    Report Status 09/10/2017 FINAL  Final  MRSA PCR Screening     Status: None   Collection Time: 09/05/17  8:54 AM  Result Value Ref Range Status   MRSA by PCR NEGATIVE NEGATIVE Final    Comment:        The GeneXpert MRSA Assay (FDA approved for NASAL specimens only), is one component of a comprehensive MRSA colonization surveillance program. It is not intended to diagnose MRSA infection nor to guide  or monitor treatment for MRSA infections. Performed at [facility], [address] [place], [place] [address]   Urine Culture     Status: None   Collection Time: 09/08/17  3:36 PM  Result Value Ref Range Status   Specimen Description URINE, CLEAN CATCH  Final   Special Requests   Final    NONE Performed at [facility], [address] [place], [place] [address]    Culture NO GROWTH  Final   Report Status 09/09/2017 FINAL  Final      Studies: No results found.  Scheduled Meds: . feeding supplement (ENSURE ENLIVE)  237 mL Oral 5 X Daily  . folic acid  1 mg Oral Daily  . heparin injection (subcutaneous)  5,000 Units Subcutaneous Q8H  . insulin aspart  0-15 Units Subcutaneous Q4H  . levothyroxine  50 mcg Oral QAC breakfast  . multivitamin with minerals  1 tablet Oral Daily  . thiamine  100 mg Oral Daily    Continuous Infusions: . sodium chloride 250 mL (09/08/17 1048)     LOS: 6 days     Briant Cedar, MD [facility]   If 7PM-7AM, please contact night-coverage [email or web address] Password [email or web address] 09/11/2017, 2:06 PM

## 2017-09-11 NOTE — Evaluation (Signed)
Physical Therapy Evaluation Patient Details Name: Bailey Alexander MRN: [id number] DOB: [birth year] Today's Date: 09/11/2017   History of Present Illness  47 y.o.Caucasian female, nursing home resident transferred to [facility] Emergency Department from [facility] for further evaluation and management of severe hyponatremia and hypokalemia. She presented to [facility] ED after fall with altered mental status. She was found to have several metabolic derangements including hyponatremia (111), hypokalemia, hypomagnesemia with hypotension. Pt was transferred to [facility] for ICU admission and nephrology consultation. In the ICU pt was placed on pressors and currently weaned off. [facility] assumed care on 09/08/17.     Clinical Impression  Pt admitted with above diagnosis. Pt currently with functional limitations due to the deficits listed below (see PT Problem List). PTA pt living at SNF, reports she has not walked in some time and transfers to w/c at facility without assistance. Upon eval pt presents with weakness and poor balance. Able to stand with Min A for stability and side step along bed with shuffling and poor balance. Believe with +2 help and close chair follow patient could progress to ambulation and would benefit from physical therapy at her facility, which she states she is currentyl not getting. "they dont walk with me at all bc they're afraid I will fall".  Pt will benefit from skilled PT to increase their independence and safety with mobility to allow discharge to the venue listed below.       Follow Up Recommendations SNF;Supervision/Assistance - 24 hour    Equipment Recommendations  (TBD next venue)    Recommendations for Other Services       Precautions / Restrictions Precautions Precautions: Fall Restrictions Weight Bearing Restrictions: No      Mobility  Bed Mobility Overal bed mobility: Needs Assistance Bed Mobility: Supine to Sit     Supine to  sit: Supervision        Transfers Overall transfer level: Needs assistance Equipment used: Rolling walker (2 wheeled) Transfers: Sit to/from Stand Sit to Stand: Min assist         General transfer comment: min A for stability once stanidng due to poor balance  Ambulation/Gait             General Gait Details: pt side stepped on bed with poor balance. 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 lives at [facility]     Hand Dominance        Extremity/Trunk Assessment   Upper Extremity Assessment Upper Extremity Assessment: Generalized weakness    Lower Extremity Assessment Lower Extremity Assessment: Generalized weakness       Communication  Communication: No difficulties  Cognition Arousal/Alertness: Awake/alert Behavior During Therapy: WFL for tasks assessed/performed Overall Cognitive Status: History of cognitive impairments - at baseline                                        General Comments      Exercises     Assessment/Plan    PT Assessment Patient needs continued PT services  PT Problem List Decreased strength;Decreased activity tolerance;Decreased range of motion;Decreased balance;Decreased mobility;Decreased cognition       PT Treatment Interventions DME instruction;Gait training;Stair training;Functional mobility  training;Therapeutic activities;Therapeutic exercise;Balance training    PT Goals (Current goals can be found in the Care Plan section)  Acute Rehab PT Goals Patient Stated Goal: go back to SNF PT Goal Formulation: With patient Time For Goal Achievement: 09/18/17 Potential to Achieve Goals: Good    Frequency Min 3X/week   Barriers to discharge        Co-evaluation               AM-PAC PT "6 Clicks" Daily Activity  Outcome Measure Difficulty turning over in bed (including adjusting bedclothes, sheets and blankets)?: A Little Difficulty moving from lying on back to sitting on the side of the bed? : A Little Difficulty sitting down on and standing up from a chair with arms (e.g., wheelchair, bedside commode, etc,.)?: A Little Help needed moving to and from a bed to chair (including a wheelchair)?: A Lot Help needed walking in hospital room?: A Lot Help needed climbing 3-5 steps with a railing? : Total 6 Click Score: 14    End of Session Equipment Utilized During Treatment: Gait belt Activity Tolerance: Patient limited by fatigue Patient left: in bed;with call bell/phone within reach Nurse Communication: Mobility status PT Visit Diagnosis: Unsteadiness on feet (R26.81);Muscle weakness (generalized) (M62.81);Difficulty in walking, not elsewhere classified (R26.2);Ataxic gait (R26.0)    Time: 0830-0900 PT Time Calculation (min) (ACUTE ONLY): 30 min   Charges:   PT Evaluation $PT Eval Low Complexity: 1 Low PT Treatments $Therapeutic Activity: 8-22 mins        [name], PT, DPT Acute Rehab Services Pager: [phone]    [name] 09/11/2017, 9:47 AM

## 2017-09-12 DIAGNOSIS — E46 Unspecified protein-calorie malnutrition: Secondary | ICD-10-CM

## 2017-09-12 DIAGNOSIS — N179 Acute kidney failure, unspecified: Secondary | ICD-10-CM

## 2017-09-12 DIAGNOSIS — N170 Acute kidney failure with tubular necrosis: Secondary | ICD-10-CM

## 2017-09-12 LAB — RENAL FUNCTION PANEL
Albumin: 2.2 g/dL — ABNORMAL LOW (ref 3.5–5.0)
Anion gap: 13 (ref 5–15)
BUN: 42 mg/dL — AB (ref 6–20)
CHLORIDE: 105 mmol/L (ref 98–111)
CO2: 23 mmol/L (ref 22–32)
CREATININE: 4.84 mg/dL — AB (ref 0.44–1.00)
Calcium: 9.1 mg/dL (ref 8.9–10.3)
GFR calc Af Amer: 12 mL/min — ABNORMAL LOW (ref >60)
GFR, EST NON AFRICAN AMERICAN: 10 mL/min — AB (ref >60)
Glucose, Bld: 106 mg/dL — ABNORMAL HIGH (ref 70–99)
Phosphorus: 4.9 mg/dL — ABNORMAL HIGH (ref 2.5–4.6)
Potassium: 4.2 mmol/L (ref 3.5–5.1)
Sodium: 141 mmol/L (ref 135–145)

## 2017-09-12 LAB — GLUCOSE, CAPILLARY
GLUCOSE-CAPILLARY: 123 mg/dL — AB (ref 70–99)
Glucose-Capillary: 102 mg/dL — ABNORMAL HIGH (ref 70–99)
Glucose-Capillary: 114 mg/dL — ABNORMAL HIGH (ref 70–99)
Glucose-Capillary: 100 mg/dL — ABNORMAL HIGH (ref 70–99)
Glucose-Capillary: 132 mg/dL — ABNORMAL HIGH (ref 70–99)
Glucose-Capillary: 105 mg/dL — ABNORMAL HIGH (ref 70–99)

## 2017-09-12 MED ORDER — LOPERAMIDE HCL 2 MG PO CAPS
4.0000 mg | ORAL_CAPSULE | Freq: Once | ORAL | Status: AC
  Administered 2017-09-12: 4 mg via ORAL
  Filled 2017-09-12: qty 2

## 2017-09-12 NOTE — Progress Notes (Signed)
Subjective:   no real change in status.  Unfortunately renal failure continues to progress- still making good urine- 1850 yest.  Asking for water  Objective Vital signs in last 24 hours: Vitals:   09/11/17 0428 09/11/17 1236 09/11/17 2028 09/12/17 0436  BP: 137/73 (!) 146/78 (!) 149/73 131/74  Pulse: 80 86 82 82  Resp: 18 20 16 16   Temp: 99 F (37.2 C) 99.1 F (37.3 C) 99.1 F (37.3 C) 98.2 F (36.8 C)  TempSrc: Oral Oral Oral Oral  SpO2: 97% 98% 98% 95%  Weight: 72.9 kg   73.5 kg  Height:       Weight change: -0.5 kg  Intake/Output Summary (Last 24 hours) at 09/12/2017 1610 Last data filed at 09/12/2017 0859 Gross per 24 hour  Intake 915 ml  Output 1850 ml  Net -935 ml    Assessment/ Plan: Pt is a 47 y.o. yo female at SNF who was admitted on 09/05/2017 with significant hyponatremia in setting of polydipsia - hosp c/b AKI Assessment/Plan: 1. Hyponatremia- corrected- at one point was having polyuria.  Asking for water today - watching sodium 2. AKI- in the setting of hypotension that was pressor requiring in the setting of polyuria during recovery of hyponatremia- hemodynamics have recovered but renal function slow to come around but now rate of rise maybe less ??  Baseline renal function normal- nonoliguric- awaiting plateau and recovery- U/A is not active, no indications for dialysis  3. Anemia- not a major issue 4. HTN/volume- previously pressor dependent thought due to hypovolemia- now corrected- have stopped IVF  Zakya Halabi A    Labs: Basic Metabolic Panel: Recent Labs  Lab 09/10/17 0259 09/11/17 0442 09/12/17 0647  NA 139 142 141  K 5.2* 4.4 4.2  CL 109 110 105  CO2 22 22 23   GLUCOSE 85 110* 106*  BUN 30* 37* 42*  CREATININE 3.86* 4.45* 4.84*  CALCIUM 8.3* 8.8* 9.1  PHOS 3.7 4.0 4.9*   Liver Function Tests: Recent Labs  Lab 09/10/17 0259 09/11/17 0442 09/12/17 0647  ALBUMIN 2.1* 2.0* 2.2*   No results for input(s): LIPASE, AMYLASE in the  last 168 hours. No results for input(s): AMMONIA in the last 168 hours. CBC: Recent Labs  Lab 09/06/17 0328 09/07/17 0326 09/08/17 0333 09/09/17 0534 09/10/17 0259  WBC 12.3* 12.2* 9.3 8.1 8.8  NEUTROABS  --   --   --  6.0 6.4  HGB 12.2 12.1 10.0* 10.3* 10.1*  HCT 36.0 36.7 31.6* 32.2* 31.5*  MCV 85.7 87.6 90.5 89.4 91.6  PLT 319 300 225 233 256   Cardiac Enzymes: Recent Labs  Lab 09/05/17 1413 09/05/17 2027 09/06/17 1038 09/06/17 1437 09/06/17 2303  TROPONINI 0.45* 0.52* 0.27* 0.26* 0.22*   CBG: Recent Labs  Lab 09/11/17 1214 09/11/17 1941 09/12/17 0001 09/12/17 0432 09/12/17 0752  GLUCAP 123* 119* 102* 114* 100*    Iron Studies: No results for input(s): IRON, TIBC, TRANSFERRIN, FERRITIN in the last 72 hours. Studies/Results: No results found. Medications: Infusions: . sodium chloride 250 mL (09/08/17 1048)    Scheduled Medications: . feeding supplement (ENSURE ENLIVE)  237 mL Oral 5 X Daily  . folic acid  1 mg Oral Daily  . heparin injection (subcutaneous)  5,000 Units Subcutaneous Q8H  . insulin aspart  0-15 Units Subcutaneous Q4H  . levothyroxine  50 mcg Oral QAC breakfast  . multivitamin with minerals  1 tablet Oral Daily  . thiamine  100 mg Oral Daily    have reviewed scheduled  and prn medications.  Physical Exam: General: NAD Heart: RRR Lungs: mostly clear Abdomen: soft, non tender Extremities: no edema    09/12/2017,9:28 AM  LOS: 7 days

## 2017-09-12 NOTE — Progress Notes (Signed)
PROGRESS NOTE  Bailey Alexander ZOX:[id number] DOB: Dec 11, 1970 DOA: 09/05/2017 PCP: Bernerd Limbo, MD  HPI/Recap of past 24 hours: 47 y.o. Caucasian female, nursing home resident transferred to [facility] Emergency Department from [facility] for further evaluation and management of severe hyponatremia and hypokalemia. She presented to [facility] ED after fall with altered mental status. She was found to have several metabolic derangements including hyponatremia (111), hypokalemia, hypomagnesemia with hypotension. Pt was transferred to [facility] for ICU admission and nephrology consultation. In the ICU pt was placed on pressors and currently weaned off. [facility] assumed care on 09/08/17.   Today, pt reported feeling better, denies any new complaints. Still not eating as much.. Stated her magic cup is too cold...  Assessment/Plan: Principal Problem:   Hyponatremia Active Problems:   Malnutrition (HCC)   Acquired hypothyroidism   Hypokalemia   Hypomagnesemia   Cardiac enzymes elevated   Pulmonary nodule  Hypovolemic hyponatremia Currently Na WNL Hx of psychogenic polydipsia (restrict free water) Monitor closely, daily renal panel  Hypotension, ??sepsis of unknown origin BP currently stable off pressors or IVF Afebrile, resolved leukocytosis BC X 2 NGTD UC grew 30,000 viridans strep, repeat showed no growth CXR no acute abnormality D/C IV hydration S/P IV Zosyn for a total of 5 days Monitor closely  AKI Cr 1.65--> 3.10-->3.86-->4.45-->4.84, hopefully has peaked (baseline WNL) Likely ATN from hypotension Nephrology on board, apprec recs D/C IVF as per Nephrology Daily renal panel  Electrolyte imbalance Hypokalemia/hypocalcemia/hypophosphatemia/hypomagnesemia Resolving ??Re-feeding syndrome Nephrology on board Nutrition on board Replace prn  Mild hyperkalemia Resolved Daily renal panel, monitor closely  Hypothyroidism Continue  Synthroid  Prolonged QTc Intermittent EKG    Code Status: Full  Family Communication: None at bedside  Disposition Plan: Back to SNF   Consultants:  Nephrology  PCCM  Procedures:  None  Antimicrobials:  Completed 5 days of Zosyn  DVT prophylaxis:  Heparin [place]   Objective: Vitals:   09/11/17 1236 09/11/17 2028 09/12/17 0436 09/12/17 1344  BP: (!) 146/78 (!) 149/73 131/74 (!) 137/95  Pulse: 86 82 82 79  Resp: 20 16 16 16   Temp: 99.1 F (37.3 C) 99.1 F (37.3 C) 98.2 F (36.8 C) 97.8 F (36.6 C)  TempSrc: Oral Oral Oral Oral  SpO2: 98% 98% 95% 98%  Weight:   73.5 kg   Height:        Intake/Output Summary (Last 24 hours) at 09/12/2017 1353 Last data filed at 09/12/2017 1332 Gross per 24 hour  Intake 1260 ml  Output 1900 ml  Net -640 ml   Filed Weights   09/10/17 1533 09/11/17 0428 09/12/17 0436  Weight: 74 kg 72.9 kg 73.5 kg    Exam:   General: NAD  Cardiovascular: S1, S2 present   Respiratory: CTAB  Abdomen: Soft, NT, ND, BS present  Musculoskeletal: No pedal edema bilaterally  Skin: Normal  Psychiatry: Normal mood   Data Reviewed: CBC: Recent Labs  Lab 09/06/17 0328 09/07/17 0326 09/08/17 0333 09/09/17 0534 09/10/17 0259  WBC 12.3* 12.2* 9.3 8.1 8.8  NEUTROABS  --   --   --  6.0 6.4  HGB 12.2 12.1 10.0* 10.3* 10.1*  HCT 36.0 36.7 31.6* 32.2* 31.5*  MCV 85.7 87.6 90.5 89.4 91.6  PLT 319 300 225 233 256   Basic Metabolic Panel: Recent Labs  Lab 09/06/17 0328  09/07/17 0326  09/08/17 0333 09/09/17 0534 09/10/17 0259 09/11/17 0442 09/12/17 0647  NA 137   < > 141   < >  135 138 139 142 141  K 4.2   < > 4.1   < > 3.7 5.6* 5.2* 4.4 4.2  CL 107   < > 112*   < > 106 107 109 110 105  CO2 22   < > 21*   < > 22 23 22 22 23   GLUCOSE 99   < > 115*   < > 113* 89 85 110* 106*  BUN <5*   < > <5*   < > 10 22* 30* 37* 42*  CREATININE 0.81   < > 0.78   < > 1.65* 3.10* 3.86* 4.45* 4.84*  CALCIUM 7.8*   < > 7.9*   < > 7.5* 8.0* 8.3*  8.8* 9.1  MG 2.5*  --  1.8  --  1.6*  --   --   --   --   PHOS 1.9*  --  2.9  --  2.0* 2.7 3.7 4.0 4.9*   < > = values in this interval not displayed.   GFR: Estimated Creatinine Clearance: 14.4 mL/min (A) (by C-G formula based on SCr of 4.84 mg/dL (H)). Liver Function Tests: Recent Labs  Lab 09/09/17 0534 09/10/17 0259 09/11/17 0442 09/12/17 0647  ALBUMIN 2.0* 2.1* 2.0* 2.2*   No results for input(s): LIPASE, AMYLASE in the last 168 hours. No results for input(s): AMMONIA in the last 168 hours. Coagulation Profile: No results for input(s): INR, PROTIME in the last 168 hours. Cardiac Enzymes: Recent Labs  Lab 09/05/17 1413 09/05/17 2027 09/06/17 1038 09/06/17 1437 09/06/17 2303  TROPONINI 0.45* 0.52* 0.27* 0.26* 0.22*   BNP (last 3 results) No results for input(s): PROBNP in the last 8760 hours. HbA1C: No results for input(s): HGBA1C in the last 72 hours. CBG: Recent Labs  Lab 09/11/17 1941 09/12/17 0001 09/12/17 0432 09/12/17 0752 09/12/17 1203  GLUCAP 119* 102* 114* 100* 132*   Lipid Profile: No results for input(s): CHOL, HDL, LDLCALC, TRIG, CHOLHDL, LDLDIRECT in the last 72 hours. Thyroid Function Tests: No results for input(s): TSH, T4TOTAL, FREET4, T3FREE, THYROIDAB in the last 72 hours. Anemia Panel: No results for input(s): VITAMINB12, FOLATE, FERRITIN, TIBC, IRON, RETICCTPCT in the last 72 hours. Urine analysis:    Component Value Date/Time   COLORURINE STRAW (A) 09/08/2017 0802   APPEARANCEUR CLEAR 09/08/2017 0802   LABSPEC 1.003 (L) 09/08/2017 0802   PHURINE 7.0 09/08/2017 0802   GLUCOSEU NEGATIVE 09/08/2017 0802   HGBUR SMALL (A) 09/08/2017 0802   BILIRUBINUR NEGATIVE 09/08/2017 0802   KETONESUR NEGATIVE 09/08/2017 0802   PROTEINUR NEGATIVE 09/08/2017 0802   NITRITE NEGATIVE 09/08/2017 0802   LEUKOCYTESUR MODERATE (A) 09/08/2017 0802   Sepsis Labs: @LABRCNTIP (procalcitonin:4,lacticidven:4)  ) Recent Results (from the past 240 hour(s))    Urine Culture     Status: Abnormal   Collection Time: 09/04/17 10:27 AM  Result Value Ref Range Status   Specimen Description   Final    URINE, CLEAN CATCH Performed at [facility], [address] [place], [place] [address]    Special Requests   Final    NONE Performed at [facility], [address] [place], [place] [address]    Culture 30,000 COLONIES/mL VIRIDANS STREPTOCOCCUS (A)  Final   Report Status 09/07/2017 FINAL  Final  Blood culture (routine x 2)     Status: None   Collection Time: 09/05/17  2:27 AM  Result Value Ref Range Status   Specimen Description LEFT ANTECUBITAL  Final   Special Requests   Final  BOTTLES DRAWN AEROBIC AND ANAEROBIC Blood Culture adequate volume   Culture   Final    NO GROWTH 5 DAYS Performed at [facility], [address], [place], [place] [address]    Report Status 09/10/2017 FINAL  Final  Blood culture (routine x 2)     Status: None   Collection Time: 09/05/17  2:36 AM  Result Value Ref Range Status   Specimen Description BLOOD LEFT HAND  Final   Special Requests   Final    BOTTLES DRAWN AEROBIC ONLY Blood Culture results may not be optimal due to an inadequate volume of blood received in culture bottles   Culture   Final    NO GROWTH 5 DAYS Performed at [facility], [address] [place], [place] [address]    Report Status 09/10/2017 FINAL  Final  MRSA PCR Screening     Status: None   Collection Time: 09/05/17  8:54 AM  Result Value Ref Range Status   MRSA by PCR NEGATIVE NEGATIVE Final    Comment:        The GeneXpert MRSA Assay (FDA approved for NASAL specimens only), is one component of a comprehensive MRSA colonization surveillance program. It is not intended to diagnose MRSA infection nor to guide or monitor treatment for MRSA infections. Performed at [facility], [address] [place], [place] [address]   Urine Culture     Status: None   Collection Time: 09/08/17  3:36 PM  Result Value Ref Range Status    Specimen Description URINE, CLEAN CATCH  Final   Special Requests   Final    NONE Performed at [facility], [address] [place], [place] [address]    Culture NO GROWTH  Final   Report Status 09/09/2017 FINAL  Final      Studies: No results found.  Scheduled Meds: . feeding supplement (ENSURE ENLIVE)  237 mL Oral 5 X Daily  . folic acid  1 mg Oral Daily  . heparin injection (subcutaneous)  5,000 Units Subcutaneous Q8H  . insulin aspart  0-15 Units Subcutaneous Q4H  . levothyroxine  50 mcg Oral QAC breakfast  . multivitamin with minerals  1 tablet Oral Daily  . thiamine  100 mg Oral Daily    Continuous Infusions: . sodium chloride 250 mL (09/08/17 1048)     LOS: 7 days     Briant Cedar, MD [facility]   If 7PM-7AM, please contact night-coverage [email or web address] Password [email or web address] 09/12/2017, 1:53 PM

## 2017-09-12 NOTE — Evaluation (Signed)
Occupational Therapy Evaluation Patient Details Name: Bailey Alexander Piltz MRN: [id number] DOB: 1970-04-20 Today's Date: 09/12/2017    History of Present Illness 47 y.o.Caucasian female, nursing home resident transferred to [facility] Department from [facility] for further evaluation and management of severe hyponatremia and hypokalemia. [facility] after fall with altered mental status. She was found to have several metabolic derangements including hyponatremia (111), hypokalemia, hypomagnesemia with hypotension. [facility] and nephrology consultation. In the ICU pt was placed on pressors and currently weaned off. [facility] assumed care on 09/08/17. PMH significant for dementia, bulimia.    Clinical Impression   PTA patient was long term resident as SNF, reporting able to transfer without assist but was WC bound and completed ADLs with assist from sitting position.  She currently requires minA-modA for UB ADL seated, total assist for toileting, max-total assist for LB ADL supine, and min assist for toilet transfers.  Patient oriented to self (birthday) but not age (reports [age]), place, but not time (reports 2011).  She presents with generalized weakness, decreased activity tolerance, impaired cognition (likely baseline) and impaired balance with high fear of falling.  Believe patient will benefit from OT services while admitted and ongoing services at SNF in order to decrease burden of care and promote increased quality of life.  Will continue to follow while admitted.     Follow Up Recommendations  SNF;Supervision/Assistance - 24 hour    Equipment Recommendations  None recommended by OT    Recommendations for Other Services       Precautions / Restrictions Precautions Precautions: Fall Restrictions Weight Bearing Restrictions: No      Mobility Bed Mobility Overal bed mobility: Needs Assistance Bed  Mobility: Supine to Sit;Sit to Supine     Supine to sit: Supervision Sit to supine: Supervision   General bed mobility comments: cueing for safety  Transfers Overall transfer level: Needs assistance Equipment used: Rolling walker (2 wheeled) Transfers: Sit to/from [employer]Stand;Stand Pivot Transfers Sit to Stand: Min assist Stand pivot transfers: Min assist       General transfer comment: minA for safety and balance    Balance Overall balance assessment: Needs assistance Sitting-balance support: Bilateral upper extremity supported;Feet supported Sitting balance-Leahy Scale: Fair     Standing balance support: During functional activity;Bilateral upper extremity supported Standing balance-Leahy Scale: Poor Standing balance comment: reliant on UE and external support                           ADL either performed or assessed with clinical judgement   ADL Overall ADL's : Needs assistance/impaired     Grooming: Wash/dry face;Sitting;Set up   Upper Body Bathing: Minimal assistance;Sitting   Lower Body Bathing: Maximal assistance;Bed level   Upper Body Dressing : Moderate assistance;Sitting   Lower Body Dressing: Total assistance;Bed level   Toilet Transfer: Minimal assistance;BSC;Stand-pivot;Cueing for safety;Cueing for sequencing   Toileting- Clothing Manipulation and Hygiene: Total assistance;Sit to/from stand       Functional mobility during ADLs: Minimal assistance(stand pivot only) General ADL Comments: highly fearful of falling, incontinent bowel and bladder, impaired standing balance and tolerance      Vision   Vision Assessment?: No apparent visual deficits     Perception     Praxis      Pertinent Vitals/Pain Pain Assessment: No/denies pain     Hand Dominance     Extremity/Trunk Assessment Upper  Extremity Assessment Upper Extremity Assessment: Generalized weakness   Lower Extremity Assessment Lower Extremity Assessment: Generalized  weakness   Cervical / Trunk Assessment Cervical / Trunk Assessment: Kyphotic   Communication Communication Communication: No difficulties   Cognition Arousal/Alertness: Awake/alert Behavior During Therapy: Anxious Overall Cognitive Status: History of cognitive impairments - at baseline                                 General Comments: patient requires increased time, 1 step commands, verbal and tactile cueing; highly fearful of falling; oriented to self and place only    General Comments       Exercises     Shoulder Instructions      Home Living Family/patient expects to be discharged to:: Skilled nursing facility                                        Prior Functioning/Environment Level of Independence: Needs assistance  Gait / Transfers Assistance Needed: reports able to transfer without assist, but is wc bound ADL's / Homemaking Assistance Needed: reports needing some assistance from sitting position   Comments: lives at SNF (long term patient) and has assistance from staff        OT Problem List: Decreased strength;Decreased activity tolerance;Impaired balance (sitting and/or standing);Decreased coordination;Decreased safety awareness;Decreased knowledge of precautions;Decreased knowledge of use of DME or AE      OT Treatment/Interventions: Self-care/ADL training;Therapeutic exercise;DME and/or AE instruction;Therapeutic activities;Patient/family education;Balance training    OT Goals(Current goals can be found in the care plan section) Acute Rehab OT Goals Patient Stated Goal: go back to SNF OT Goal Formulation: With patient Time For Goal Achievement: 09/26/17 Potential to Achieve Goals: Good  OT Frequency: Min 2X/week   Barriers to D/C:            Co-evaluation              AM-PAC PT "6 Clicks" Daily Activity     Outcome Measure Help from another person eating meals?: A Little Help from another person taking care of  personal grooming?: A Little Help from another person toileting, which includes using toliet, bedpan, or urinal?: Total Help from another person bathing (including washing, rinsing, drying)?: A Lot Help from another person to put on and taking off regular upper body clothing?: A Little Help from another person to put on and taking off regular lower body clothing?: Total 6 Click Score: 13   End of Session Equipment Utilized During Treatment: Gait belt Nurse Communication: Mobility status;Other (comment)(pt needs)  Activity Tolerance: Patient tolerated treatment well Patient left: in bed;with call bell/phone within reach;with bed alarm set  OT Visit Diagnosis: Other abnormalities of gait and mobility (R26.89);Muscle weakness (generalized) (M62.81);Other symptoms and signs involving cognitive function                Time: 1610-9604 OT Time Calculation (min): 23 min Charges:  OT General Charges $OT Visit: 1 Visit OT Evaluation $OT Eval Low Complexity: 1 Low OT Treatments $Self Care/Home Management : 8-22 mins  Chancy Milroy, OTR/L  Pager [phone]   Chancy Milroy 09/12/2017, 1:34 PM

## 2017-09-13 LAB — GLUCOSE, CAPILLARY
GLUCOSE-CAPILLARY: 109 mg/dL — AB (ref 70–99)
GLUCOSE-CAPILLARY: 88 mg/dL (ref 70–99)
GLUCOSE-CAPILLARY: 106 mg/dL — AB (ref 70–99)
Glucose-Capillary: 117 mg/dL — ABNORMAL HIGH (ref 70–99)
Glucose-Capillary: 126 mg/dL — ABNORMAL HIGH (ref 70–99)
Glucose-Capillary: 118 mg/dL — ABNORMAL HIGH (ref 70–99)

## 2017-09-13 LAB — RENAL FUNCTION PANEL
ANION GAP: 13 (ref 5–15)
Albumin: 2.3 g/dL — ABNORMAL LOW (ref 3.5–5.0)
BUN: 49 mg/dL — AB (ref 6–20)
CALCIUM: 9.5 mg/dL (ref 8.9–10.3)
CO2: 27 mmol/L (ref 22–32)
Chloride: 99 mmol/L (ref 98–111)
Creatinine, Ser: 4.82 mg/dL — ABNORMAL HIGH (ref 0.44–1.00)
GFR calc Af Amer: 12 mL/min — ABNORMAL LOW (ref >60)
GFR calc non Af Amer: 10 mL/min — ABNORMAL LOW (ref >60)
GLUCOSE: 99 mg/dL (ref 70–99)
POTASSIUM: 4.1 mmol/L (ref 3.5–5.1)
Phosphorus: 5.2 mg/dL — ABNORMAL HIGH (ref 2.5–4.6)
SODIUM: 139 mmol/L (ref 135–145)

## 2017-09-13 LAB — TROPONIN I: Troponin I: 0.03 ng/mL (ref <0.03)

## 2017-09-13 MED ORDER — HYDROCODONE-ACETAMINOPHEN 5-325 MG PO TABS
2.0000 | ORAL_TABLET | Freq: Once | ORAL | Status: AC
  Administered 2017-09-13: 2 via ORAL
  Filled 2017-09-13: qty 2

## 2017-09-13 NOTE — Progress Notes (Signed)
PROGRESS NOTE  Bailey Alexander ZOX:[id number] DOB: 1970-06-26 DOA: 09/05/2017 PCP: Bernerd LimboAriza, Fernando Enrique, MD  HPI/Recap of past 24 hours: 47 y.o. Caucasian female, nursing home resident transferred to [facility] Emergency Department from [facility] for further evaluation and management of severe hyponatremia and hypokalemia. She presented to [facility] ED after fall with altered mental status. She was found to have several metabolic derangements including hyponatremia (111), hypokalemia, hypomagnesemia with hypotension. [facility] and nephrology consultation. In the ICU pt was placed on pressors and currently weaned off. TRH assumed care on 09/08/17.   Today, pt reported feeling better, denies any new complaints.   Assessment/Plan: Principal Problem:   Hyponatremia Active Problems:   Malnutrition (HCC)   Acquired hypothyroidism   Hypokalemia   Hypomagnesemia   Cardiac enzymes elevated   Pulmonary nodule  Hypovolemic hyponatremia Currently Na WNL Hx of psychogenic polydipsia (restrict free water) Monitor closely, daily renal panel  Hypotension, ??sepsis of unknown origin BP currently stable off pressors or IVF Afebrile, resolved leukocytosis BC X 2 NGTD UC grew 30,000 viridans strep, repeat showed no growth CXR no acute abnormality D/C IV hydration S/P IV Zosyn for a total of 5 days Monitor closely  AKI Cr 1.65--> 3.10-->3.86-->4.45-->4.84-->4.82, hopefully has reached the plateau phase Baseline Cr WNL Likely ATN from hypotension Nephrology on board, apprec recs D/C IVF as per Nephrology Daily renal panel  Electrolyte imbalance Hypokalemia/hypocalcemia/hypophosphatemia/hypomagnesemia Nephrology on board Nutrition on board Replace prn  Mild hyperkalemia Resolved Daily renal panel, monitor closely  Hypothyroidism Continue Synthroid  Prolonged QTc Intermittent EKG    Code Status:  Full  Family Communication: None at bedside  Disposition Plan: Back to SNF   Consultants:  Nephrology  PCCM  Procedures:  None  Antimicrobials:  Completed 5 days of Zosyn  DVT prophylaxis:  Heparin Harrison   Objective: Vitals:   09/12/17 0436 09/12/17 1344 09/12/17 2044 09/13/17 0411  BP: 131/74 (!) 137/95 (!) 141/78 121/74  Pulse: 82 79 83 72  Resp: 16 16 18 18   Temp: 98.2 F (36.8 C) 97.8 F (36.6 C) 98.4 F (36.9 C) 98.2 F (36.8 C)  TempSrc: Oral Oral Oral Oral  SpO2: 95% 98% 98% 97%  Weight: 73.5 kg   73.6 kg  Height:        Intake/Output Summary (Last 24 hours) at 09/13/2017 1215 Last data filed at 09/13/2017 0925 Gross per 24 hour  Intake 1757 ml  Output 2550 ml  Net -793 ml   Filed Weights   09/11/17 0428 09/12/17 0436 09/13/17 0411  Weight: 72.9 kg 73.5 kg 73.6 kg    Exam:   General: NAD  Cardiovascular: S1, S2 present   Respiratory: CTAB  Abdomen: Soft, NT, ND, BS present  Musculoskeletal: No pedal edema bilaterally  Skin: Normal  Psychiatry: Normal mood   Data Reviewed: CBC: Recent Labs  Lab 09/07/17 0326 09/08/17 0333 09/09/17 0534 09/10/17 0259  WBC 12.2* 9.3 8.1 8.8  NEUTROABS  --   --  6.0 6.4  HGB 12.1 10.0* 10.3* 10.1*  HCT 36.7 31.6* 32.2* 31.5*  MCV 87.6 90.5 89.4 91.6  PLT 300 225 233 256   Basic Metabolic Panel: Recent Labs  Lab 09/07/17 0326  09/08/17 0333 09/09/17 0534 09/10/17 0259 09/11/17 0442 09/12/17 0647 09/13/17 0636  NA 141   < > 135 138 139 142 141 139  K 4.1   < > 3.7 5.6* 5.2* 4.4 4.2 4.1  CL 112*   < >  106 107 109 110 105 99  CO2 21*   < > 22 23 22 22 23 27   GLUCOSE 115*   < > 113* 89 85 110* 106* 99  BUN <5*   < > 10 22* 30* 37* 42* 49*  CREATININE 0.78   < > 1.65* 3.10* 3.86* 4.45* 4.84* 4.82*  CALCIUM 7.9*   < > 7.5* 8.0* 8.3* 8.8* 9.1 9.5  MG 1.8  --  1.6*  --   --   --   --   --   PHOS 2.9  --  2.0* 2.7 3.7 4.0 4.9* 5.2*   < > = values in this interval not displayed.    GFR: Estimated Creatinine Clearance: 14.5 mL/min (A) (by C-G formula based on SCr of 4.82 mg/dL (H)). Liver Function Tests: Recent Labs  Lab 09/09/17 0534 09/10/17 0259 09/11/17 0442 09/12/17 0647 09/13/17 0636  ALBUMIN 2.0* 2.1* 2.0* 2.2* 2.3*   No results for input(s): LIPASE, AMYLASE in the last 168 hours. No results for input(s): AMMONIA in the last 168 hours. Coagulation Profile: No results for input(s): INR, PROTIME in the last 168 hours. Cardiac Enzymes: Recent Labs  Lab 09/06/17 1437 09/06/17 2303  TROPONINI 0.26* 0.22*   BNP (last 3 results) No results for input(s): PROBNP in the last 8760 hours. HbA1C: No results for input(s): HGBA1C in the last 72 hours. CBG: Recent Labs  Lab 09/12/17 1644 09/12/17 2104 09/13/17 0058 09/13/17 0356 09/13/17 0751  GLUCAP 123* 105* 117* 109* 88   Lipid Profile: No results for input(s): CHOL, HDL, LDLCALC, TRIG, CHOLHDL, LDLDIRECT in the last 72 hours. Thyroid Function Tests: No results for input(s): TSH, T4TOTAL, FREET4, T3FREE, THYROIDAB in the last 72 hours. Anemia Panel: No results for input(s): VITAMINB12, FOLATE, FERRITIN, TIBC, IRON, RETICCTPCT in the last 72 hours. Urine analysis:    Component Value Date/Time   COLORURINE STRAW (A) 09/08/2017 0802   APPEARANCEUR CLEAR 09/08/2017 0802   LABSPEC 1.003 (L) 09/08/2017 0802   PHURINE 7.0 09/08/2017 0802   GLUCOSEU NEGATIVE 09/08/2017 0802   HGBUR SMALL (A) 09/08/2017 0802   BILIRUBINUR NEGATIVE 09/08/2017 0802   KETONESUR NEGATIVE 09/08/2017 0802   PROTEINUR NEGATIVE 09/08/2017 0802   NITRITE NEGATIVE 09/08/2017 0802   LEUKOCYTESUR MODERATE (A) 09/08/2017 0802   Sepsis Labs: @LABRCNTIP (procalcitonin:4,lacticidven:4)  ) Recent Results (from the past 240 hour(s))  Urine Culture     Status: Abnormal   Collection Time: 09/04/17 10:27 AM  Result Value Ref Range Status   Specimen Description   Final    URINE, CLEAN CATCH Performed at [facility], [address] [place], [place] [address]    Special Requests   Final    NONE Performed at [facility], [address] [place], [place] [address]    Culture 30,000 COLONIES/mL VIRIDANS STREPTOCOCCUS (A)  Final   Report Status 09/07/2017 FINAL  Final  Blood culture (routine x 2)     Status: None   Collection Time: 09/05/17  2:27 AM  Result Value Ref Range Status   Specimen Description LEFT ANTECUBITAL  Final   Special Requests   Final    BOTTLES DRAWN AEROBIC AND ANAEROBIC Blood Culture adequate volume   Culture   Final    NO GROWTH 5 DAYS Performed at [facility], [address], [place], [place] [address]    Report Status 09/10/2017 FINAL  Final  Blood culture (routine x 2)     Status: None   Collection Time: 09/05/17  2:36 AM  Result Value Ref Range Status   Specimen Description BLOOD LEFT HAND  Final   Special Requests   Final    BOTTLES DRAWN AEROBIC ONLY Blood Culture results may not be optimal due to an inadequate volume of blood received in culture bottles   Culture   Final    NO GROWTH 5 DAYS Performed at [facility], [address], [place], [place] [address]    Report Status 09/10/2017 FINAL  Final  MRSA PCR Screening     Status: None   Collection Time: 09/05/17  8:54 AM  Result Value Ref Range Status   MRSA by PCR NEGATIVE NEGATIVE Final    Comment:        The GeneXpert MRSA Assay (FDA approved for NASAL specimens only), is one component of a comprehensive MRSA colonization surveillance program. It is not intended to diagnose MRSA infection nor to guide or monitor treatment for MRSA infections. Performed at [facility], [address] [place], [place] [address]   Urine Culture     Status: None   Collection Time: 09/08/17  3:36 PM  Result Value Ref Range Status   Specimen Description URINE, CLEAN CATCH  Final   Special Requests   Final    NONE Performed at [facility], [address], [place], [place] [address]    Culture NO GROWTH  Final   Report  Status 09/09/2017 FINAL  Final      Studies: No results found.  Scheduled Meds: . feeding supplement (ENSURE ENLIVE)  237 mL Oral 5 X Daily  . folic acid  1 mg Oral Daily  . heparin injection (subcutaneous)  5,000 Units Subcutaneous Q8H  . insulin aspart  0-15 Units Subcutaneous Q4H  . levothyroxine  50 mcg Oral QAC breakfast  . multivitamin with minerals  1 tablet Oral Daily  . thiamine  100 mg Oral Daily    Continuous Infusions: . sodium chloride 250 mL (09/08/17 1048)     LOS: 8 days     Briant Cedar, MD [facility]   If 7PM-7AM, please contact night-coverage [email or web address] Password [facility] 09/13/2017, 12:15 PM

## 2017-09-13 NOTE — Progress Notes (Signed)
Pt c/o chest pain 8/10 BP (!) 150/81 (BP Location: Left Arm)   Pulse 82   Temp 98.2 F (36.8 C) (Oral)   Resp 18   Ht 5\' 4"  (1.626 m)   Wt 73.6 kg   LMP  (LMP Unknown)   SpO2 96%   BMI 27.85 kg/m  taken Ekg done.placed pt on  02 2lnc paged [facility] of [facility]. Waiting for call back

## 2017-09-13 NOTE — Progress Notes (Signed)
Subjective: Interval History: no complaints today, was helped up the other day but couldn't stand well per pt.    Objective: Vital signs in last 24 hours: Temp:  [97.8 F (36.6 C)-98.4 F (36.9 C)] 98.2 F (36.8 C) (08/26 0411) Pulse Rate:  [72-83] 72 (08/26 0411) Resp:  [16-18] 18 (08/26 0411) BP: (121-141)/(74-95) 121/74 (08/26 0411) SpO2:  [97 %-98 %] 97 % (08/26 0411) Weight:  [73.6 kg] 73.6 kg (08/26 0411) Weight change: 0.1 kg  Intake/Output from previous day: 08/25 0701 - 08/26 0700 In: 1900 [P.O.:1900] Out: 2100 [Urine:2100] Intake/Output this shift: No intake/output data recorded.  Cardiac: normal rate and rhythm, clear s1 and s2 Pulmonary: CTAB, not in distress Abdominal: non distended abdomen, soft and nontender Extremities: no LE edema Mental status: Alert and oriented x2  Lab Results: No results for input(s): WBC, HGB, HCT, PLT in the last 72 hours. BMET:  Recent Labs    09/11/17 0442 09/12/17 0647  NA 142 141  K 4.4 4.2  CL 110 105  CO2 22 23  GLUCOSE 110* 106*  BUN 37* 42*  CREATININE 4.45* 4.84*  CALCIUM 8.8* 9.1   No results for input(s): PTH in the last 72 hours. Iron Studies: No results for input(s): IRON, TIBC, TRANSFERRIN, FERRITIN in the last 72 hours. CBG (last 3)  Recent Labs    09/12/17 2104 09/13/17 0058 09/13/17 0356  GLUCAP 105* 117* 109*     Studies/Results: No results found.  I have reviewed the patient's current medications.  Assessment/Plan: 1. Hyponatremia: resolved at one point was having polyuria. 2. AKI: in the setting of hypotension that was pressor requiring in the setting of polyuria during recovery of hyponatremia- hemodynamics have recovered but renal function slow to come around but seems to have plateaued at cr of 4.8.  Baseline renal function normal, continues to be normotensive, still making good urine and keeping volume even, lytes and acid base stable, no indications for dialysis hopefully will recover some  more function.  -avoid all renal toxic medications -no IV contrast of any kind -continue to monitor bp  3. Normocytic anemia: uncertain etiology likely related to malnutrition, now nutritionist on board, getting regular meals with ensure, vitamin and mineral supplements.  Folate and B12 normal on admission  -will check iron studies with am labs    LOS: 8 days   Bailey Alexander 09/13/2017,7:18 AM

## 2017-09-14 LAB — IRON AND TIBC
Iron: 76 ug/dL (ref 28–170)
Saturation Ratios: 33 % — ABNORMAL HIGH (ref 10.4–31.8)
TIBC: 231 ug/dL — ABNORMAL LOW (ref 250–450)
UIBC: 155 ug/dL

## 2017-09-14 LAB — RENAL FUNCTION PANEL
ALBUMIN: 2.4 g/dL — AB (ref 3.5–5.0)
Anion gap: 12 (ref 5–15)
BUN: 51 mg/dL — AB (ref 6–20)
CO2: 27 mmol/L (ref 22–32)
Calcium: 9.8 mg/dL (ref 8.9–10.3)
Chloride: 96 mmol/L — ABNORMAL LOW (ref 98–111)
Creatinine, Ser: 4.39 mg/dL — ABNORMAL HIGH (ref 0.44–1.00)
GFR calc Af Amer: 13 mL/min — ABNORMAL LOW (ref >60)
GFR calc non Af Amer: 11 mL/min — ABNORMAL LOW (ref >60)
GLUCOSE: 103 mg/dL — AB (ref 70–99)
PHOSPHORUS: 6.4 mg/dL — AB (ref 2.5–4.6)
POTASSIUM: 4 mmol/L (ref 3.5–5.1)
SODIUM: 135 mmol/L (ref 135–145)

## 2017-09-14 LAB — GLUCOSE, CAPILLARY
GLUCOSE-CAPILLARY: 125 mg/dL — AB (ref 70–99)
Glucose-Capillary: 93 mg/dL (ref 70–99)
Glucose-Capillary: 85 mg/dL (ref 70–99)
Glucose-Capillary: 93 mg/dL (ref 70–99)
Glucose-Capillary: 97 mg/dL (ref 70–99)
Glucose-Capillary: 116 mg/dL — ABNORMAL HIGH (ref 70–99)

## 2017-09-14 LAB — FERRITIN: Ferritin: 63 ng/mL (ref 11–307)

## 2017-09-14 LAB — TROPONIN I

## 2017-09-14 MED ORDER — PRO-STAT SUGAR FREE PO LIQD
30.0000 mL | Freq: Two times a day (BID) | ORAL | Status: DC
  Administered 2017-09-14: 30 mL via ORAL
  Filled 2017-09-14: qty 30

## 2017-09-14 MED ORDER — PRO-STAT SUGAR FREE PO LIQD
30.0000 mL | Freq: Three times a day (TID) | ORAL | Status: DC
  Administered 2017-09-14 – 2017-09-16 (×7): 30 mL via ORAL
  Filled 2017-09-14 (×8): qty 30

## 2017-09-14 MED ORDER — NEPRO/CARBSTEADY PO LIQD
237.0000 mL | Freq: Three times a day (TID) | ORAL | Status: DC
  Administered 2017-09-14: 237 mL via ORAL
  Filled 2017-09-14 (×2): qty 237

## 2017-09-14 MED ORDER — FERROUS GLUCONATE 324 (38 FE) MG PO TABS
324.0000 mg | ORAL_TABLET | Freq: Every day | ORAL | Status: DC
  Administered 2017-09-15 – 2017-09-16 (×2): 324 mg via ORAL
  Filled 2017-09-14 (×3): qty 1

## 2017-09-14 NOTE — Plan of Care (Signed)
  Problem: Clinical Measurements: Goal: Ability to maintain clinical measurements within normal limits will improve Outcome: Progressing   Problem: Clinical Measurements: Goal: Diagnostic test results will improve Outcome: Progressing   Problem: Clinical Measurements: Goal: Cardiovascular complication will be avoided Outcome: Progressing   

## 2017-09-14 NOTE — Progress Notes (Signed)
Subjective: Interval History: no complaints today, eager to go home had some chest pain yesterday says it resolved with tylenol, no cp this am   Objective: Vital signs in last 24 hours: Temp:  [98.2 F (36.8 C)-98.9 F (37.2 C)] 98.6 F (37 C) (08/27 0535) Pulse Rate:  [72-95] 72 (08/27 0535) Resp:  [16-18] 16 (08/27 0535) BP: (101-150)/(66-81) 101/66 (08/27 0535) SpO2:  [90 %-96 %] 90 % (08/27 0535) Weight:  [75.4 kg] 75.4 kg (08/27 0535) Weight change: 1.8 kg  Intake/Output from previous day: 08/26 0701 - 08/27 0700 In: 2134 [P.O.:2134] Out: 3650 [Urine:3650] Intake/Output this shift: No intake/output data recorded.  Cardiac: normal rate and rhythm, clear s1 and s2 Pulmonary: CTAB, not in distress Abdominal: non distended abdomen, soft and nontender Extremities: no LE edema Mental status: Alert, conversant, eager to leave   Lab Results:  No results for input(s): WBC, HGB, HCT, PLT in the last 72 hours. BMET:  Recent Labs    09/13/17 0636 09/14/17 0406  NA 139 135  K 4.1 4.0  CL 99 96*  CO2 27 27  GLUCOSE 99 103*  BUN 49* 51*  CREATININE 4.82* 4.39*  CALCIUM 9.5 9.8   No results for input(s): PTH in the last 72 hours. Iron Studies:  Recent Labs    09/14/17 0406  IRON 76  TIBC 231*  FERRITIN 63   CBG (last 3)  Recent Labs    09/14/17 0038 09/14/17 0530 09/14/17 0754  GLUCAP 125* 93 85     Studies/Results: No results found.  I have reviewed the patient's current medications.  Assessment/Plan: 1. Hyponatremia: resolved rapidly on admission with IV fluids.  Na starting to trend back down will implement fluid restriction 1200mL 2. AKI: Baseline renal function normal, occurred in the setting of hypotension that was pressor requiring in the setting of polyuria during recovery of hyponatremia- hemodynamics have recovered but renal function slow to come around but seems to have plateaued at cr of 4.8. Cr now trending down, output up in last 24 hours to  almost 4L morning bp low.  May now be developing a post ATN diuresis   -will fluid restrict to 1200cc -avoid all renal toxic medications -no IV contrast of any kind -continue to monitor bp closely   3. Normocytic anemia: uncertain etiology likely related to malnutrition, now nutritionist on board, getting regular meals with ensure, vitamin and mineral supplements.  Folate and B12 normal on admission.  Iron studies suggestive of iron deficiency.   -iron studies suggestive of iron deficiency will start pt on ferrous gluconate 324mg  daily.       LOS: 9 days   Brandon Joyice Magda 09/14/2017,7:59 AM

## 2017-09-14 NOTE — Progress Notes (Signed)
Patient complaining of chest pain- V.S taken; pt stated SOB; pt has complained of chest pain before.  Paged MD MD returned call- will order a troponin, EKG, place back on tele.

## 2017-09-14 NOTE — Care Management Note (Signed)
Case Management Note  Patient Details  Name: Bailey Alexander MRN: [id number] Date of Birth: 08/12/1970  Subjective/Objective:   Hyponatremia               Action/Plan: [facility]; PCP: Bernerd LimboAriza, Fernando Enrique, MD; has private insurance with Medicaid with prescription drug coverage; CM following patient for progression of care.  Expected Discharge Date:  09/09/17               Expected Discharge Plan:  Skilled Nursing Facility  In-House Referral:  Clinical Social Work  Discharge planning Services  CM Consult  Status of Service:  Completed, signed off  Reola MosherChandler, Inanna Telford L, RN,MHA,BSN [phone] 09/14/2017, 2:38 PM

## 2017-09-14 NOTE — Progress Notes (Signed)
Nutrition Follow-up  DOCUMENTATION CODES:   Not applicable  INTERVENTION:   -Increase 30 ml Prostat to TID, each supplement provides 100 kcals and 15 grams protein -Continue MVI with minerals -D/c Magic Cup, due to poor acceptance -Nepro po TID, each supplement provides 425 kcal and 19 grams of protein (711 ml fluid)  NUTRITION DIAGNOSIS:   Inadequate oral intake related to poor appetite, lethargy/confusion, acute illness, chronic illness as evidenced by per patient/family report, NPO status.  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 Pt has been residing in nursing home since January 2018. Pt with hx of ADEK deficiencies and hx of bulimia, anorexia and EtOH abuse. Pt is wheelchair bound  Spoke with pt at bedside, who reports her appetite is mildly improving. Intake still remains minimal (PO: 10-50%). Pt shares she consumed a few bites of eggs without difficulty, but her cereal "came right back up" when she tried to eat it (per pt, this is a chronic problem for her). Pt reports consuming Ensure when offered, "but they told me that I was drinking too much".   Noted that was placed on 1200 ml fluid restriction as of 09/14/17. Ensure has been d/c and MD has ordered Prostat (which pt is taking).   The majority of pt intake is coming from supplements; recommend supplement regimen above will provide 1575 kcals and 102 grams protein.   Labs reviewed: K WDL, Phos: 6.4 (increasing), BGS: 85-125 (inpatient orders for glycemic control are 0-15 units insulin aspart every 4 hours).   Diet Order:   Diet Order           Diet regular Room service appropriate? Yes; Fluid consistency: Thin; Fluid restriction: 1200 mL Fluid  Diet effective now              EDUCATION NEEDS:   No education needs have been identified at this time  Skin:  Skin Assessment: Reviewed RN Assessment  Last BM:  09/14/17  Height:   Ht Readings from Last 1 Encounters:  09/10/17 5\' 4"  (1.626 m)    Weight:   Wt Readings from Last 1 Encounters:  09/14/17 75.4 kg    Ideal Body Weight:  54.5 kg  BMI:  Body mass index is 28.53 kg/m.  Estimated Nutritional Needs:   Kcal:  1750-1950 kcals   Protein:  85-95 g  Fluid:  per MD    Malahki Gasaway A. Mayford KnifeWilliams, RD, LDN, CDE Pager: [phone] After hours Pager: [phone]

## 2017-09-14 NOTE — Progress Notes (Signed)
PROGRESS NOTE  Bailey Alexander WUJ:[id number] DOB: [birth year] DOA: 09/05/2017 PCP: Bernerd Limbo, MD  HPI/Recap of past 24 hours: 47 y.o. Caucasian female, nursing home resident transferred to [facility] Emergency Department from [facility] for further evaluation and management of severe hyponatremia and hypokalemia. She presented to [facility] ED after fall with altered mental status. She was found to have several metabolic derangements including hyponatremia (111), hypokalemia, hypomagnesemia with hypotension. Pt was transferred to [facility] for ICU admission and nephrology consultation. In the ICU pt was placed on pressors and currently weaned off. [facility] assumed care on 09/08/17.   Overnight, pt c/o chest pain.Today, pt reported feeling better, denies any chest pain, SOB or abdominal pain this am.    Assessment/Plan: Principal Problem:   Hyponatremia Active Problems:   Malnutrition (HCC)   Acquired hypothyroidism   Hypokalemia   Hypomagnesemia   Cardiac enzymes elevated   Pulmonary nodule  Hypovolemic hyponatremia Currently Na WNL Hx of psychogenic polydipsia (restrict free water) Monitor closely, daily renal panel  Hypotension, ??sepsis of unknown origin BP currently stable off pressors or IVF Afebrile, resolved leukocytosis BC X 2 NGTD UC grew 30,000 viridans strep, repeat showed no growth CXR no acute abnormality D/C IV hydration S/P IV Zosyn for a total of 5 days Monitor closely  AKI 2/2 ATN Slowing downtrending Cr 1.65--> 3.10-->3.86-->4.45-->4.84-->4.82-->4.39 Baseline Cr WNL Likely ATN from hypotension Nephrology on board, apprec recs D/C IVF as per Nephrology Daily renal panel  Electrolyte imbalance Hypokalemia/hypocalcemia/hypophosphatemia/hypomagnesemia Nephrology on board Nutrition on board Replace prn  Mild hyperkalemia Resolved Daily renal panel, monitor closely  Hypothyroidism Continue  Synthroid  Prolonged QTc Last EKG on 09/13/17 showed normalized QTc  Intermittent EKG  Elevated troponin on admission Currently chest pain free Likely due to demand ischemia Repeat troponin 0.03, down from 0.52 on admission Current EKG with no acute ST changes,  ECHO showed EF of 50-55%, Grade 1DD, no RWMA Monitor closely   Code Status: Full  Family Communication: None at bedside  Disposition Plan: Back to SNF   Consultants:  Nephrology  PCCM  Procedures:  None  Antimicrobials:  Completed 5 days of Zosyn  DVT prophylaxis:  Heparin    Objective: Vitals:   09/13/17 1850 09/13/17 1958 09/14/17 0535 09/14/17 0857  BP: (!) 146/76 (!) 150/81 101/66 (!) 116/59  Pulse: 89 82 72 (!) 102  Resp: 18 18 16 18   Temp: 98.9 F (37.2 C) 98.2 F (36.8 C) 98.6 F (37 C) 98.2 F (36.8 C)  TempSrc: Oral Oral Oral Oral  SpO2: 94% 96% 90% 99%  Weight:   75.4 kg   Height:        Intake/Output Summary (Last 24 hours) at 09/14/2017 0902 Last data filed at 09/14/2017 0601 Gross per 24 hour  Intake 1897 ml  Output 3200 ml  Net -1303 ml   Filed Weights   09/12/17 0436 09/13/17 0411 09/14/17 0535  Weight: 73.5 kg 73.6 kg 75.4 kg    Exam:   General: NAD  Cardiovascular: S1, S2 present   Respiratory: CTAB  Abdomen: Soft, NT, ND, BS present  Musculoskeletal: No pedal edema bilaterally  Skin: Normal  Psychiatry: Normal mood   Data Reviewed: CBC: Recent Labs  Lab 09/08/17 0333 09/09/17 0534 09/10/17 0259  WBC 9.3 8.1 8.8  NEUTROABS  --  6.0 6.4  HGB 10.0* 10.3* 10.1*  HCT 31.6* 32.2* 31.5*  MCV 90.5 89.4 91.6  PLT 225 233 256   Basic Metabolic Panel:  Recent Labs  Lab 09/08/17 0333  09/10/17 0259 09/11/17 0442 09/12/17 0647 09/13/17 0636 09/14/17 0406  NA 135   < > 139 142 141 139 135  K 3.7   < > 5.2* 4.4 4.2 4.1 4.0  CL 106   < > 109 110 105 99 96*  CO2 22   < > 22 22 23 27 27   GLUCOSE 113*   < > 85 110* 106* 99 103*  BUN 10   < > 30*  37* 42* 49* 51*  CREATININE 1.65*   < > 3.86* 4.45* 4.84* 4.82* 4.39*  CALCIUM 7.5*   < > 8.3* 8.8* 9.1 9.5 9.8  MG 1.6*  --   --   --   --   --   --   PHOS 2.0*   < > 3.7 4.0 4.9* 5.2* 6.4*   < > = values in this interval not displayed.   GFR: Estimated Creatinine Clearance: 16.1 mL/min (A) (by C-G formula based on SCr of 4.39 mg/dL (H)). Liver Function Tests: Recent Labs  Lab 09/10/17 0259 09/11/17 0442 09/12/17 0647 09/13/17 0636 09/14/17 0406  ALBUMIN 2.1* 2.0* 2.2* 2.3* 2.4*   No results for input(s): LIPASE, AMYLASE in the last 168 hours. No results for input(s): AMMONIA in the last 168 hours. Coagulation Profile: No results for input(s): INR, PROTIME in the last 168 hours. Cardiac Enzymes: Recent Labs  Lab 09/13/17 2303  TROPONINI 0.03*   BNP (last 3 results) No results for input(s): PROBNP in the last 8760 hours. HbA1C: No results for input(s): HGBA1C in the last 72 hours. CBG: Recent Labs  Lab 09/13/17 1612 09/13/17 1959 09/14/17 0038 09/14/17 0530 09/14/17 0754  GLUCAP 126* 118* 125* 93 85   Lipid Profile: No results for input(s): CHOL, HDL, LDLCALC, TRIG, CHOLHDL, LDLDIRECT in the last 72 hours. Thyroid Function Tests: No results for input(s): TSH, T4TOTAL, FREET4, T3FREE, THYROIDAB in the last 72 hours. Anemia Panel: Recent Labs    09/14/17 0406  FERRITIN 63  TIBC 231*  IRON 76   Urine analysis:    Component Value Date/Time   COLORURINE STRAW (A) 09/08/2017 0802   APPEARANCEUR CLEAR 09/08/2017 0802   LABSPEC 1.003 (L) 09/08/2017 0802   PHURINE 7.0 09/08/2017 0802   GLUCOSEU NEGATIVE 09/08/2017 0802   HGBUR SMALL (A) 09/08/2017 0802   BILIRUBINUR NEGATIVE 09/08/2017 0802   KETONESUR NEGATIVE 09/08/2017 0802   PROTEINUR NEGATIVE 09/08/2017 0802   NITRITE NEGATIVE 09/08/2017 0802   LEUKOCYTESUR MODERATE (A) 09/08/2017 0802   Sepsis Labs: @LABRCNTIP (procalcitonin:4,lacticidven:4)  ) Recent Results (from the past 240 hour(s))  Urine  Culture     Status: Abnormal   Collection Time: 09/04/17 10:27 AM  Result Value Ref Range Status   Specimen Description   Final    URINE, CLEAN CATCH Performed at [facility], [address] [place], [place] [address]    Special Requests   Final    NONE Performed at [facility], [address], [place], [place] [address]    Culture 30,000 COLONIES/mL VIRIDANS STREPTOCOCCUS (A)  Final   Report Status 09/07/2017 FINAL  Final  Blood culture (routine x 2)     Status: None   Collection Time: 09/05/17  2:27 AM  Result Value Ref Range Status   Specimen Description LEFT ANTECUBITAL  Final   Special Requests   Final    BOTTLES DRAWN AEROBIC AND ANAEROBIC Blood Culture adequate volume   Culture   Final    NO GROWTH 5 DAYS  Performed at [facility], [address] [place], [place] [address]    Report Status 09/10/2017 FINAL  Final  Blood culture (routine x 2)     Status: None   Collection Time: 09/05/17  2:36 AM  Result Value Ref Range Status   Specimen Description BLOOD LEFT HAND  Final   Special Requests   Final    BOTTLES DRAWN AEROBIC ONLY Blood Culture results may not be optimal due to an inadequate volume of blood received in culture bottles   Culture   Final    NO GROWTH 5 DAYS Performed at [facility], [address] [place], [place] [address]    Report Status 09/10/2017 FINAL  Final  MRSA PCR Screening     Status: None   Collection Time: 09/05/17  8:54 AM  Result Value Ref Range Status   MRSA by PCR NEGATIVE NEGATIVE Final    Comment:        The GeneXpert MRSA Assay (FDA approved for NASAL specimens only), is one component of a comprehensive MRSA colonization surveillance program. It is not intended to diagnose MRSA infection nor to guide or monitor treatment for MRSA infections. Performed at [facility], [address] [place], [place] [address]   Urine Culture     Status: None   Collection Time: 09/08/17  3:36 PM  Result Value Ref Range Status    Specimen Description URINE, CLEAN CATCH  Final   Special Requests   Final    NONE Performed at [facility], [address] [place], [place] [address]    Culture NO GROWTH  Final   Report Status 09/09/2017 FINAL  Final      Studies: No results found.  Scheduled Meds: . feeding supplement (ENSURE ENLIVE)  237 mL Oral 5 X Daily  . folic acid  1 mg Oral Daily  . heparin injection (subcutaneous)  5,000 Units Subcutaneous Q8H  . insulin aspart  0-15 Units Subcutaneous Q4H  . levothyroxine  50 mcg Oral QAC breakfast  . multivitamin with minerals  1 tablet Oral Daily  . thiamine  100 mg Oral Daily    Continuous Infusions: . sodium chloride 250 mL (09/08/17 1048)     LOS: 9 days     Briant CedarNkeiruka J Akanksha Bellmore, MD [facility]   If 7PM-7AM, please contact night-coverage [email or web address] Password [email or web address] 09/14/2017, 9:02 AM

## 2017-09-14 NOTE — Progress Notes (Signed)
Physical Therapy Treatment Patient Details Name: Bailey Alexander MRN: [id number] DOB: 05/01/1970 Today's Date: 09/14/2017    History of Present Illness 47 y.o.Caucasian female, SNF resident admitted with severe hyponatremia and hypokalemia, hypotension. Pt s/p fall with AMS at sNF. PMHx significant for dementia, bulimia.     PT Comments    Pt pleasant in bed on arrival. Pt with improved bed mobility and transfers, able to progress ambulation 5 feet mod a+1 with seated rest break through two trials. Pt with complaint of stabbing chest pain prior to session, HR 100, SpO2 97%. Nurse aware and in room through session. BP taken after ambulation 151/90, HR 122, SpO2 98%. Pt perseverating on wanting sister to take her home for a few days. Reoriented throughout session of location and situation.   Follow Up Recommendations  SNF;Supervision/Assistance - 24 hour     Equipment Recommendations  None recommended by PT    Recommendations for Other Services       Precautions / Restrictions Precautions Precautions: Fall Restrictions Weight Bearing Restrictions: No    Mobility  Bed Mobility Overal bed mobility: Needs Assistance Bed Mobility: Supine to Sit     Supine to sit: Supervision;HOB elevated        Transfers Overall transfer level: Needs assistance Equipment used: Rolling walker (2 wheeled) Transfers: Sit to/from [employer] Sit to Stand: Min assist Stand pivot transfers: Min assist       General transfer comment: minA for safety and balance, sit to stand improved through four trials, stand/pivot to bedside commode two trials   Ambulation/Gait Ambulation/Gait assistance: Mod assist(chair follow ) Gait Distance (Feet): 5 Feet(two trials ) Assistive device: Rolling walker (2 wheeled) Gait Pattern/deviations: Step-through pattern;Decreased stride length;Trunk flexed;Wide base of support Gait velocity: decreased  Gait velocity interpretation: <1.8 ft/sec,  indicate of risk for recurrent falls General Gait Details: mod a +1 for balance, pt with flexed trunk, wide BOS and hyperextension of bil knees throughout, pt limited more by fear of falling than fatigue. HR 120s with ambulation    Stairs             Wheelchair Mobility    Modified Rankin (Stroke Patients Only)       Balance Overall balance assessment: Needs assistance   Sitting balance-Leahy Scale: Fair Sitting balance - Comments: able to sit at [facility] and EOB without UE support     Standing balance-Leahy Scale: Poor Standing balance comment: reliant on UE and external support                            Cognition Arousal/Alertness: Awake/alert Behavior During Therapy: WFL for tasks assessed/performed Overall Cognitive Status: No family/caregiver present to determine baseline cognitive functioning Area of Impairment: Orientation;Memory;Following commands;Problem solving;Safety/judgement                 Orientation Level: Disoriented to;Time;Situation;Place   Memory: Decreased short-term memory Following Commands: Follows one step commands consistently Safety/Judgement: Decreased awareness of safety   Problem Solving: Slow processing;Decreased initiation;Requires verbal cues General Comments: pt with decreased memory asking to call sister at least 10x during session to stay at her house. Pt unaware of when she has to void or when she has purewick      Exercises      General Comments        Pertinent Vitals/Pain Pain Assessment: 0-10 Pain Score: 7  Pain Location: chest Pain Descriptors / Indicators: Aching Pain Intervention(s): Limited activity within patient's tolerance;Repositioned;Monitored  during session    Home Living                      Prior Function            PT Goals (current goals can now be found in the care plan section) Progress towards PT goals: Progressing toward goals    Frequency    Min 2X/week       PT Plan Current plan remains appropriate;Frequency needs to be updated    Co-evaluation              AM-PAC PT "6 Clicks" Daily Activity  Outcome Measure  Difficulty turning over in bed (including adjusting bedclothes, sheets and blankets)?: A Little Difficulty moving from lying on back to sitting on the side of the bed? : A Little Difficulty sitting down on and standing up from a chair with arms (e.g., wheelchair, bedside commode, etc,.)?: A Little Help needed moving to and from a bed to chair (including a wheelchair)?: A Little Help needed walking in hospital room?: A Little Help needed climbing 3-5 steps with a railing? : A Lot 6 Click Score: 17    End of Session Equipment Utilized During Treatment: Gait belt Activity Tolerance: Patient tolerated treatment well Patient left: in chair;with call bell/phone within reach;with chair alarm set;with nursing/sitter in room Nurse Communication: Mobility status PT Visit Diagnosis: Unsteadiness on feet (R26.81);Muscle weakness (generalized) (M62.81);Difficulty in walking, not elsewhere classified (R26.2);Ataxic gait (R26.0)     Time: [id number] PT Time Calculation (min) (ACUTE ONLY): 22 min  Charges:  $Therapeutic Activity: 8-22 mins                     Carma LairKara Dorwin Fitzhenry, [place]PT  Acute Rehab [phone]    Carma LairKara Samari Bittinger 09/14/2017, 2:14 PM

## 2017-09-15 LAB — GLUCOSE, CAPILLARY
GLUCOSE-CAPILLARY: 93 mg/dL (ref 70–99)
GLUCOSE-CAPILLARY: 90 mg/dL (ref 70–99)
GLUCOSE-CAPILLARY: 99 mg/dL (ref 70–99)
Glucose-Capillary: 107 mg/dL — ABNORMAL HIGH (ref 70–99)
Glucose-Capillary: 90 mg/dL (ref 70–99)
Glucose-Capillary: 93 mg/dL (ref 70–99)

## 2017-09-15 LAB — RENAL FUNCTION PANEL
ALBUMIN: 2.7 g/dL — AB (ref 3.5–5.0)
Anion gap: 14 (ref 5–15)
BUN: 60 mg/dL — AB (ref 6–20)
CO2: 26 mmol/L (ref 22–32)
Calcium: 9.8 mg/dL (ref 8.9–10.3)
Chloride: 95 mmol/L — ABNORMAL LOW (ref 98–111)
Creatinine, Ser: 4.39 mg/dL — ABNORMAL HIGH (ref 0.44–1.00)
GFR calc Af Amer: 13 mL/min — ABNORMAL LOW (ref >60)
GFR calc non Af Amer: 11 mL/min — ABNORMAL LOW (ref >60)
GLUCOSE: 109 mg/dL — AB (ref 70–99)
PHOSPHORUS: 6.6 mg/dL — AB (ref 2.5–4.6)
Potassium: 3.6 mmol/L (ref 3.5–5.1)
SODIUM: 135 mmol/L (ref 135–145)

## 2017-09-15 LAB — TROPONIN I: TROPONIN I: 0.03 ng/mL — AB (ref <0.03)

## 2017-09-15 MED ORDER — SODIUM CHLORIDE 0.9 % IV SOLN
INTRAVENOUS | Status: AC
  Administered 2017-09-15: 13:00:00 via INTRAVENOUS

## 2017-09-15 MED ORDER — HYDROCODONE-ACETAMINOPHEN 5-325 MG PO TABS
1.0000 | ORAL_TABLET | Freq: Once | ORAL | Status: AC
  Administered 2017-09-15: 1 via ORAL
  Filled 2017-09-15: qty 1

## 2017-09-15 NOTE — Progress Notes (Signed)
Occupational Therapy Treatment Patient Details Name: Bailey Alexander MRN: [id number] DOB: 10-19-1970 Today's Date: 09/15/2017    History of present illness Bailey Alexander is a 46yofemale presented to [facility] via SNF, then transfer to [facility] d/t severe hyponatremia, hypokalemia, hypotension. PMHx significant for dementia, bulimia.    OT comments  Pt is progressing slow within OT POC. Pt spent majority of session perseverating on calling sister. Pt completed stand pivot transfer to [facility] with min A and heavy vc for technique. Total A required for clothing management and peri hygiene with pt refusing to attempt d/t fear of falling. Recommend d/c to SNF and continued OT services.    Follow Up Recommendations  SNF;Supervision/Assistance - 24 hour    Equipment Recommendations  None recommended by OT    Recommendations for Other Services PT consult;Speech consult    Precautions / Restrictions Precautions Precautions: Fall Restrictions Weight Bearing Restrictions: No       Mobility Bed Mobility Overal bed mobility: Needs Assistance Bed Mobility: Supine to Sit Rolling: Independent   Supine to sit: Independent Sit to supine: Supervision   General bed mobility comments: supervision for safety d/t disorientation and cognitive defcitis.   Transfers Overall transfer level: Needs assistance Equipment used: None Transfers: Sit to/from [employer] Sit to Stand: Min guard Stand pivot transfers: Min assist       General transfer comment: Min A overall, however with prolonged standing for peri hygiene pt became fearful and required mod A to transfer back to bed with high impulsivity     Balance Overall balance assessment: Needs assistance Sitting-balance support: Bilateral upper extremity supported;Feet supported Sitting balance-Leahy Scale: Fair Sitting balance - Comments: able to sit at [facility] and EOB without UE support   Standing balance support: During functional  activity;Bilateral upper extremity supported Standing balance-Leahy Scale: Poor Standing balance comment: reliant on UE and external support                           ADL either performed or assessed with clinical judgement   ADL Overall ADL's : Needs assistance/impaired   Eating/Feeding Details (indicate cue type and reason): attempted to encourage pt to eat, pt refused Grooming: Wash/dry face;Sitting;Set up                   Toilet Transfer: Minimal assistance;BSC;Cueing for safety;Stand-pivot Toilet Transfer Details (indicate cue type and reason): Bed > BSC min A. BSC > bed mod A Toileting- Clothing Manipulation and Hygiene: Total assistance;Sit to/from stand       Functional mobility during ADLs: Minimal assistance General ADL Comments: highly fearful of falling, incontinent bowel and bladder, impaired standing balance and tolerance      Vision   Vision Assessment?: No apparent visual deficits   Perception     Praxis      Cognition Arousal/Alertness: Awake/alert Behavior During Therapy: Restless;Anxious Overall Cognitive Status: History of cognitive impairments - at baseline Area of Impairment: Attention;Memory;Orientation;Following commands;Safety/judgement                 Orientation Level: Disoriented to;Place;Situation Current Attention Level: Focused;Sustained Memory: Decreased short-term memory Following Commands: Follows one step commands consistently Safety/Judgement: Decreased awareness of safety   Problem Solving: Slow processing;Requires verbal cues General Comments: Pt perseverating on need to call sister and with high anxiety re falling        Exercises     Shoulder Instructions       General Comments  Pertinent Vitals/ Pain       Pain Assessment: No/denies pain  Home Living                                          Prior Functioning/Environment              Frequency  Min 2X/week         Progress Toward Goals  OT Goals(current goals can now be found in the care plan section)     Acute Rehab OT Goals Patient Stated Goal: call sister OT Goal Formulation: With patient Time For Goal Achievement: 09/26/17 Potential to Achieve Goals: Good  Plan Discharge plan remains appropriate    Co-evaluation                 AM-PAC PT "6 Clicks" Daily Activity     Outcome Measure   Help from another person eating meals?: A Little Help from another person taking care of personal grooming?: A Little Help from another person toileting, which includes using toliet, bedpan, or urinal?: Total Help from another person bathing (including washing, rinsing, drying)?: A Lot Help from another person to put on and taking off regular upper body clothing?: A Little Help from another person to put on and taking off regular lower body clothing?: Total 6 Click Score: 13    End of Session Equipment Utilized During Treatment: Gait belt  OT Visit Diagnosis: Other abnormalities of gait and mobility (R26.89);Muscle weakness (generalized) (M62.81);Other symptoms and signs involving cognitive function   Activity Tolerance Patient tolerated treatment well   Patient Left in bed;with call bell/phone within reach;with bed alarm set   Nurse Communication Mobility status        Time: 1312-1330 OT Time Calculation (min): 18 min  Charges: OT General Charges $OT Visit: 1 Visit OT Treatments $Self Care/Home Management : 8-22 mins   Crissie ReeseSandra H Panda Crossin OTR/L 09/15/2017, 1:39 PM

## 2017-09-15 NOTE — Progress Notes (Signed)
Subjective: Interval History: no complaints today, denies CP or SOB, says she is still peeing a lot.  Objective: Vital signs in last 24 hours: Temp:  [97.7 F (36.5 C)-99 F (37.2 C)] 97.7 F (36.5 C) (08/28 0408) Pulse Rate:  [77-115] 77 (08/28 0408) Resp:  [18] 18 (08/28 0408) BP: (113-150)/(59-91) 113/77 (08/28 0408) SpO2:  [92 %-99 %] 92 % (08/28 0408) Weight:  [71.6 kg] 71.6 kg (08/28 0408) Weight change: -3.8 kg  Intake/Output from previous day: 08/27 0701 - 08/28 0700 In: 840 [P.O.:840] Out: 3200 [Urine:3200] Intake/Output this shift: No intake/output data recorded.  Cardiac: normal rate and rhythm, clear s1 and s2 Pulmonary: CTAB, not in distress Abdominal: non distended abdomen, soft and nontender Extremities: no LE edema Mental status: Alert, conversant, in good spirits  Lab Results: No results for input(s): WBC, HGB, HCT, PLT in the last 72 hours. BMET:  Recent Labs    09/14/17 0406 09/15/17 0037  NA 135 135  K 4.0 3.6  CL 96* 95*  CO2 27 26  GLUCOSE 103* 109*  BUN 51* 60*  CREATININE 4.39* 4.39*  CALCIUM 9.8 9.8   No results for input(s): PTH in the last 72 hours. Iron Studies:  Recent Labs    09/14/17 0406  IRON 76  TIBC 231*  FERRITIN 63   CBG (last 3)  Recent Labs    09/14/17 2003 09/15/17 0009 09/15/17 0407  GLUCAP 116* 107* 93     Studies/Results: No results found.  I have reviewed the patient's current medications.  Assessment/Plan: 1. Hyponatremia: resolved rapidly on admission with IV fluids.  Na starting to trend back down with polyuria, will implement fluid restriction 1200mL.    -Na stable today at 135 will add 50cc/hr NS for volume loss  2. AKI: Baseline renal function normal, occurred in the setting of hypotension that was pressor requiring in the setting of polyuria during recovery of hyponatremia- hemodynamics have recovered but renal function slow to come around but seems to have plateaued at cr of 4.8. Cr now  trending down, output up in last 24 hours to almost 4L morning bp low.  May now be developing a post ATN diuresis  -still having quite a bit of output and net negative again yesterday to 2.3L will add 50cc/hr NS -will fluid restrict to decrease her free water intake 1200cc -avoid all renal toxic medications -no IV contrast of any kind -continue to monitor bp closely   3. Normocytic anemia: uncertain etiology likely related to malnutrition, now nutritionist on board, getting regular meals with protein supplementation, vitamin and mineral supplements.  Folate and B12 normal on admission.  Iron studies suggestive of iron deficiency.   -continue oral iron     LOS: 10 days   Brandon Leroy Trim 09/15/2017,7:59 AM

## 2017-09-15 NOTE — Progress Notes (Signed)
PROGRESS NOTE    Bailey Alexander  FAO:[id number] DOB: 18-Apr-1970 DOA: 09/05/2017 PCP: Bernerd Limbo, MD    Brief Narrative:  47 year old female who presented with hyponatremia and hypokalemia.  She initially presented to the [facility] due to recurrent falls,  She does have history of malnutrition, alcohol abuse and polydipsia. She was found with severe hyponatremia and hypokalemia.  On her initial physical examination blood pressure 83/57, heart rate 85, temperature 97.7, respiratory rate 25, oxygen saturation 100%.  Patient had dry mucous membranes, lungs are clear to auscultation bilaterally, heart sounds are present and rhythmic, soft nontender, lower extremity edema.  Patient was neurologically nonfocal.  Patient was admitted to the hospital with the working diagnosis of hypovolemic hypotension complicated by hyponatremia and hypokalemia.   Assessment & Plan:   Principal Problem:   Hyponatremia Active Problems:   Malnutrition (HCC)   Acquired hypothyroidism   Hypokalemia   Hypomagnesemia   Cardiac enzymes elevated   Pulmonary nodule  1. Hyponatremia related to hypovolemia complicated with AKI. Na continue to be at 135, with serum cl at 95 and bicarbonate at 26. Will continue to follow renal panel in am. Placed on slow rate of normal saline at 50 ml per hour. Serum ct has plateau at 4,39 with K at 3,6. Avoid hypotension and nephrotoxic medications.   2. Hypothyroid. Will continue levothyroxine  3. Anemia of chronic disease. Iron panel with transferrin saturation of 33 with ferritin 63 and tibc of 231, serum iron at 76. Will hold on Iron supplements for now.     4. T2DM. Patient tolerating po well, will continue insulin sliding scale for glucose cover and monitoring.   5. Calorie protein malnutrition. Continue nutritional supplements.    DVT prophylaxis: heparin   Code Status:  full Family Communication: no family at the beside  Disposition Plan/ discharge  barriers: pending renal function improvement before discharge to snf.    Consultants:   Nephrology   Procedures:     Antimicrobials:       Subjective: This am feeling weak and deconditioned, no nausea or vomiting, no chest pain or dyspnea.   Objective: Vitals:   09/14/17 2006 09/15/17 0408 09/15/17 0816 09/15/17 1114  BP: (!) 145/82 113/77 121/68 126/80  Pulse: 98 77 85 78  Resp: 18 18 16 17   Temp: 99 F (37.2 C) 97.7 F (36.5 C)  98.4 F (36.9 C)  TempSrc: Oral Oral  Oral  SpO2: 94% 92% 98% 93%  Weight:  71.6 kg    Height:        Intake/Output Summary (Last 24 hours) at 09/15/2017 1228 Last data filed at 09/15/2017 1000 Gross per 24 hour  Intake 360 ml  Output 2801 ml  Net -2441 ml   Filed Weights   09/13/17 0411 09/14/17 0535 09/15/17 0408  Weight: 73.6 kg 75.4 kg 71.6 kg    Examination:   General: Not in pain or dyspnea, deconditioned  Neurology: Awake and alert, non focal  E ENT: mild pallor, no icterus, oral mucosa moist Cardiovascular: No JVD. S1-S2 present, rhythmic, no gallops, rubs, or murmurs. No lower extremity edema. Pulmonary: vesicular breath sounds bilaterally, adequate air movement, no wheezing, rhonchi or rales. Gastrointestinal. Abdomen with no organomegaly, non tender, no rebound or guarding Skin. No rashes Musculoskeletal: no joint deformities     Data Reviewed: I have personally reviewed following labs and imaging studies  CBC: Recent Labs  Lab 09/09/17 0534 09/10/17 0259  WBC 8.1 8.8  NEUTROABS 6.0 6.4  HGB 10.3* 10.1*  HCT 32.2* 31.5*  MCV 89.4 91.6  PLT 233 256   Basic Metabolic Panel: Recent Labs  Lab 09/11/17 0442 09/12/17 0647 09/13/17 0636 09/14/17 0406 09/15/17 0037  NA 142 141 139 135 135  K 4.4 4.2 4.1 4.0 3.6  CL 110 105 99 96* 95*  CO2 22 23 27 27 26   GLUCOSE 110* 106* 99 103* 109*  BUN 37* 42* 49* 51* 60*  CREATININE 4.45* 4.84* 4.82* 4.39* 4.39*  CALCIUM 8.8* 9.1 9.5 9.8 9.8  PHOS 4.0 4.9* 5.2*  6.4* 6.6*   GFR: Estimated Creatinine Clearance: 15.7 mL/min (A) (by C-G formula based on SCr of 4.39 mg/dL (H)). Liver Function Tests: Recent Labs  Lab 09/11/17 0442 09/12/17 0647 09/13/17 0636 09/14/17 0406 09/15/17 0037  ALBUMIN 2.0* 2.2* 2.3* 2.4* 2.7*   No results for input(s): LIPASE, AMYLASE in the last 168 hours. No results for input(s): AMMONIA in the last 168 hours. Coagulation Profile: No results for input(s): INR, PROTIME in the last 168 hours. Cardiac Enzymes: Recent Labs  Lab 09/13/17 2303 09/14/17 1448 09/15/17 0037  TROPONINI 0.03* <0.03 0.03*   BNP (last 3 results) No results for input(s): PROBNP in the last 8760 hours. HbA1C: No results for input(s): HGBA1C in the last 72 hours. CBG: Recent Labs  Lab 09/14/17 2003 09/15/17 0009 09/15/17 0407 09/15/17 0758 09/15/17 1153  GLUCAP 116* 107* 93 90 90   Lipid Profile: No results for input(s): CHOL, HDL, LDLCALC, TRIG, CHOLHDL, LDLDIRECT in the last 72 hours. Thyroid Function Tests: No results for input(s): TSH, T4TOTAL, FREET4, T3FREE, THYROIDAB in the last 72 hours. Anemia Panel: Recent Labs    09/14/17 0406  FERRITIN 63  TIBC 231*  IRON 76      Radiology Studies: I have reviewed all of the imaging during this hospital visit personally     Scheduled Meds: . feeding supplement (PRO-STAT SUGAR FREE 64)  30 mL Oral TID BM  . ferrous gluconate  324 mg Oral Q breakfast  . folic acid  1 mg Oral Daily  . heparin injection (subcutaneous)  5,000 Units Subcutaneous Q8H  . insulin aspart  0-15 Units Subcutaneous Q4H  . levothyroxine  50 mcg Oral QAC breakfast  . multivitamin with minerals  1 tablet Oral Daily  . thiamine  100 mg Oral Daily   Continuous Infusions: . sodium chloride 250 mL (09/08/17 1048)  . sodium chloride       LOS: 10 days        Coralie KeensMauricio Daniel Arrien, MD [facility] Pager [phone]

## 2017-09-15 NOTE — Clinical Social Work Note (Signed)
CSW continues to follow for discharge needs.  Ger Ringenberg, CSW [phone]  

## 2017-09-15 NOTE — Progress Notes (Signed)
Physical Therapy Treatment Patient Details Name: Bailey Alexander MRN: [id number] DOB: [birth year] Today's Date: 09/15/2017    History of Present Illness Bailey Gary FleetHundley is a 46yofemale [facility] d/t severe hyponatremia, hypokalemia, hypotension. PMHx significant for dementia, bulimia.     PT Comments    Pt tolerating entire session without c/o pain in chest. Pt able to perform 2 stand-pivot transfers, and 5x STS with RW at supervision to min-guard. Pt continues to verbalize her own concern for falling, but demonstrate safe technique with RW, no noted impulsivity. Short term memory deficits remain apparent in session, and pt continues to perseverate on contacting mother/sister. Pt progressing well. Assisted with bowel movement and self clean-up in session, then NA helped with quality assurance to prevent skin breakdown.    Follow Up Recommendations  SNF;Supervision/Assistance - 24 hour     Equipment Recommendations  None recommended by PT    Recommendations for Other Services       Precautions / Restrictions Precautions Precautions: Fall Restrictions Weight Bearing Restrictions: No    Mobility  Bed Mobility Overal bed mobility: Needs Assistance Bed Mobility: Supine to Sit Rolling: Independent   Supine to sit: Independent     General bed mobility comments: supervision for safety d/t disorientation and cognitive defcitis.   Transfers Overall transfer level: Needs assistance Equipment used: Rolling walker (2 wheeled) Transfers: Sit to/from UGI CorporationStand;Stand Pivot Transfers Sit to Stand: Supervision;Min guard(5x from chair with RW, demonstrates safe hand placement, low impulsivity. ) Stand pivot transfers: Min guard(wide based gait)          Ambulation/Gait Ambulation/Gait assistance: (defered at this time to practice additional transfers, still with polyuria )               Stairs             Wheelchair Mobility     Modified Rankin (Stroke Patients Only)       Balance                                            Cognition Arousal/Alertness: Awake/alert Behavior During Therapy: WFL for tasks assessed/performed;Flat affect Overall Cognitive Status: History of cognitive impairments - at baseline Area of Impairment: Attention;Memory;Orientation;Following commands;Safety/judgement                 Orientation Level: Disoriented to;Place;Situation Current Attention Level: Focused;Sustained Memory: Decreased short-term memory(repetitive conversation loops ) Following Commands: Follows one step commands consistently Safety/Judgement: (awareness of falls risk, voices cautionary approach to mobility to avoid falling)   Problem Solving: Slow processing;Requires verbal cues General Comments: Continues to perseverate on author contacting her mother and sister. Improved awareness of need to void and improved ability to practice cognitive continence for short periods, however still with polyuria in session (audible on purewick), voices need to defecate which is successful on BSC.      Exercises Total Joint Exercises Heel Slides: AROM;Both;15 reps;Supine Hip ABduction/ADduction: AROM;Supine;Both;15 reps General Exercises - Lower Extremity Mini-Sqauts: Strengthening;Both;15 reps;Supine(AR/ROM manually reisted by Chartered loss adjusterAuthor)    General Comments        Pertinent Vitals/Pain Pain Assessment: No/denies pain Pain Intervention(s): Limited activity within patient's tolerance;Monitored during session    Home Living                      Prior Function  PT Goals (current goals can now be found in the care plan section) Acute Rehab PT Goals Patient Stated Goal: go back to SNF PT Goal Formulation: With patient Time For Goal Achievement: 09/18/17 Potential to Achieve Goals: Good Progress towards PT goals: Progressing toward goals    Frequency    Min  2X/week      PT Plan Current plan remains appropriate;Frequency needs to be updated    Co-evaluation              AM-PAC PT "6 Clicks" Daily Activity  Outcome Measure  Difficulty turning over in bed (including adjusting bedclothes, sheets and blankets)?: A Little Difficulty moving from lying on back to sitting on the side of the bed? : A Little Difficulty sitting down on and standing up from a chair with arms (e.g., wheelchair, bedside commode, etc,.)?: A Little Help needed moving to and from a bed to chair (including a wheelchair)?: A Little Help needed walking in hospital room?: A Little Help needed climbing 3-5 steps with a railing? : A Lot 6 Click Score: 17    End of Session   Activity Tolerance: Patient tolerated treatment well;No increased pain Patient left: in chair;with call bell/phone within reach;with chair alarm set;with nursing/sitter in room Nurse Communication: Mobility status(help with cleanup ) PT Visit Diagnosis: Unsteadiness on feet (R26.81);Muscle weakness (generalized) (M62.81);Difficulty in walking, not elsewhere classified (R26.2);Ataxic gait (R26.0)     Time: 4098-1191 PT Time Calculation (min) (ACUTE ONLY): 23 min  Charges:  $Therapeutic Exercise: 8-22 mins $Therapeutic Activity: 8-22 mins                     9:42 AM, 09/15/17 Rosamaria Lints, PT, DPT Physical Therapist - [place] [phone] (Pager)  [phone] (Office)      Teagen Bucio C 09/15/2017, 9:39 AM

## 2017-09-15 NOTE — Plan of Care (Signed)
  Problem: Health Behavior/Discharge Planning: Goal: Ability to manage health-related needs will improve Outcome: Progressing   Problem: Activity: Goal: Risk for activity intolerance will decrease Outcome: Progressing   Problem: Nutrition: Goal: Adequate nutrition will be maintained Outcome: Progressing   

## 2017-09-16 DIAGNOSIS — R579 Shock, unspecified: Secondary | ICD-10-CM

## 2017-09-16 LAB — RENAL FUNCTION PANEL
ALBUMIN: 2.7 g/dL — AB (ref 3.5–5.0)
ANION GAP: 13 (ref 5–15)
BUN: 67 mg/dL — AB (ref 6–20)
CO2: 21 mmol/L — ABNORMAL LOW (ref 22–32)
Calcium: 8.4 mg/dL — ABNORMAL LOW (ref 8.9–10.3)
Chloride: 104 mmol/L (ref 98–111)
Creatinine, Ser: 4.13 mg/dL — ABNORMAL HIGH (ref 0.44–1.00)
GFR, EST AFRICAN AMERICAN: 14 mL/min — AB (ref >60)
GFR, EST NON AFRICAN AMERICAN: 12 mL/min — AB (ref >60)
Glucose, Bld: 100 mg/dL — ABNORMAL HIGH (ref 70–99)
PHOSPHORUS: 5 mg/dL — AB (ref 2.5–4.6)
Potassium: 3.2 mmol/L — ABNORMAL LOW (ref 3.5–5.1)
SODIUM: 138 mmol/L (ref 135–145)

## 2017-09-16 LAB — GLUCOSE, CAPILLARY
GLUCOSE-CAPILLARY: 102 mg/dL — AB (ref 70–99)
GLUCOSE-CAPILLARY: 87 mg/dL (ref 70–99)
GLUCOSE-CAPILLARY: 96 mg/dL (ref 70–99)
GLUCOSE-CAPILLARY: 98 mg/dL (ref 70–99)
Glucose-Capillary: 97 mg/dL (ref 70–99)

## 2017-09-16 MED ORDER — POTASSIUM CHLORIDE CRYS ER 20 MEQ PO TBCR
20.0000 meq | EXTENDED_RELEASE_TABLET | Freq: Every day | ORAL | Status: AC
  Administered 2017-09-16: 20 meq via ORAL
  Filled 2017-09-16: qty 1

## 2017-09-16 MED ORDER — ALPRAZOLAM 0.25 MG PO TABS: 0.2500 mg | ORAL_TABLET | ORAL | 0 refills | Status: DC

## 2017-09-16 NOTE — Progress Notes (Signed)
Patients sister Donney Dice(Angela Wilson) is here to get patient.

## 2017-09-16 NOTE — Discharge Summary (Signed)
Physician Discharge Summary  Nia Nathaniel ZOX:[id number] DOB: 04/18/1970 DOA: 09/05/2017  PCP: Bernerd Limbo, MD  Admit date: 09/05/2017 Discharge date: 09/16/2017  Admitted From: SNF  Disposition:  SNF   Recommendations for Outpatient Follow-up and new medication changes:  1. Follow up with Dr. Eden Emms in 7 days 2. Follow on renal panel in 7 days.  3. Incidental 6 mm nodule right middle lobe.  Non contrast CT chest in 6 to 12 months is recommended.  Home Health: na   Equipment/Devices: na    Discharge Condition: stable  CODE STATUS: full  Diet recommendation: regular  Brief/Interim Summary: 47 year old female who presented with hyponatremia and hypokalemia.  She initially presented to the [facility] due to recurrent falls. She does have history of malnutrition, alcohol abuse and compulsive polydipsia. She was found with severe hyponatremia and hypokalemia.  On her initial physical examination blood pressure 83/57, heart rate 85, temperature 97.7, respiratory rate 25, oxygen saturation 100%.  Patient had dry mucous membranes, lungs were clear to auscultation bilaterally, heart sounds S1-S2  present and rhythmic, abdomen soft nontender, no lower extremity edema.  Patient was neurologically nonfocal.  Sodium 111, potassium 2.0, chloride 72, bicarb 24, glucose 109, BUN less than 5, creatinine 0.48, white cell count 21, hemoglobin 15.5, hematocrit 42.0, platelets 345.  Analysis with specific gravity 1.001, urinary sodium 12, potassium 4, osmolality 53.  Head CT with no acute changes.  CT of the abdomen and chest with no acute changes.  Incidental 55 to 6 mm nodule in the right middle lobe. Chest x-ray with increased lung markings bilaterally, right base atelectasis.  No infiltrates.  EKG with normal sinus rhythm, poor R wave progression.  Patient was admitted to the hospital with the working diagnosis of hypovolemic hypotension complicated by hyponatremia and hypokalemia.  1.   Hypovolemic shock.  Patient was admitted to the intensive care unit, she received IV fluids but hypotension was refractive to volume resucitation, and required vasopressors with phenylephrine. She was successfully weaned off vasopressors and was transferred to the medical ward where she remained hemodynamically stable.  2.  Acute kidney injury due to acute tubular necrosis, complicated by hyponatremia and hypokalemia..  Patient received IV fluids and supportive medical therapy, including vasopressors, her peak creatinine was 4.84, at discharge her creatinine is down to 4.13.  Her last 24-hour urine output is 2000 mL's, consistent with polyuric phase of acute tubular necrosis.  Continue to avoid nephrotoxic agents and hypotension, follow-up renal panel within 7 days.  Serum sodium was slowly corrected, her diet was advanced progressively.  Discharge sodium 138, potassium 3.2, chloride 104 and serum bicarbonate 21.   3.  Hypothyroid.  Continue levothyroxine.  4.  Anemia of chronic disease.  Hemoglobin and hematocrit remained stable, iron stores showed a transferrin saturation of 33, ferritin 63, TIBC of 231 and serum iron 76.  5.  Type 2 diabetes mellitus.  Patient was placed on insulin sliding scale for glucose coverage and monitoring, copper glucose remained well controlled.  6.  Calorie protein malnutrition.  Patient was placed on nutritional supplements.  7. Depression. Continue trazodone and sertraline. As needed alprazolam  Discharge Diagnoses:  Principal Problem:   Hyponatremia Active Problems:   Malnutrition (HCC)   Acquired hypothyroidism   Hypokalemia   Hypomagnesemia   Cardiac enzymes elevated   Pulmonary nodule    Discharge Instructions   Allergies as of 09/16/2017      Reactions   Abilify [aripiprazole]       Medication  List    STOP taking these medications   ARIPiprazole 10 MG tablet Commonly known as:  ABILIFY   megestrol 400 MG/10ML suspension Commonly known as:   MEGACE   ondansetron 4 MG disintegrating tablet Commonly known as:  ZOFRAN-ODT   Vitamin D (Ergocalciferol) 50000 units Caps capsule Commonly known as:  DRISDOL     TAKE these medications   acetaminophen 500 MG tablet Commonly known as:  TYLENOL Take 500 mg by mouth every 8 (eight) hours as needed for mild pain.   ALPRAZolam 0.25 MG tablet Commonly known as:  XANAX Take 1 tablet (0.25 mg total) by mouth every morning.   CALCIUM HIGH POTENCY/VITAMIN D 600-200 MG-UNIT Tabs Generic drug:  Calcium Carbonate-Vitamin D Take 1 tablet by mouth daily.   feeding supplement Liqd Commonly known as:  BOOST / RESOURCE BREEZE Take 90 mLs by mouth 2 (two) times daily.   levothyroxine 50 MCG tablet Commonly known as:  SYNTHROID, LEVOTHROID Take 1 tablet (50 mcg total) by mouth daily before breakfast.   loratadine 10 MG tablet Commonly known as:  CLARITIN Take 10 mg by mouth daily.   Menthol-Zinc Oxide 0.44-20.6 % Oint Apply 1 application topically 3 (three) times daily. Apply to the buttocks   nitroGLYCERIN 0.4 MG SL tablet Commonly known as:  NITROSTAT Place 0.4 mg under the tongue every 5 (five) minutes as needed for chest pain.   sertraline 25 MG tablet Commonly known as:  ZOLOFT Take 25 mg by mouth daily.   traZODone 50 MG tablet Commonly known as:  DESYREL Take 50 mg by mouth at bedtime. What changed:  Another medication with the same name was removed. Continue taking this medication, and follow the directions you see here.      Contact information for after-discharge care    Destination    [facility] .   Service:  Skilled Nursing Contact information: [address] [place] [address] [phone]             Allergies  Allergen Reactions  . Abilify [Aripiprazole]     Consultations:  Nephrology    Procedures/Studies: Ct Abdomen Pelvis Wo Contrast  Result Date: 09/05/2017 CLINICAL DATA:  Nausea and vomiting with abdominal  distention. Elevated creatinine. EXAM: CT ABDOMEN AND PELVIS WITHOUT CONTRAST TECHNIQUE: Multidetector CT imaging of the abdomen and pelvis was performed following the standard protocol without IV contrast. COMPARISON:  None. FINDINGS: Lower chest: Atelectasis in both lung bases. 5.5 mm nodule in the anterior right middle lung. Hepatobiliary: No focal liver abnormality is seen. No gallstones, gallbladder wall thickening, or biliary dilatation. Pancreas: Unremarkable. No pancreatic ductal dilatation or surrounding inflammatory changes. Spleen: Normal in size without focal abnormality. Adrenals/Urinary Tract: Adrenal glands are unremarkable. Kidneys are normal, without renal calculi, focal lesion, or hydronephrosis. Bladder is unremarkable. Stomach/Bowel: Stomach, small bowel, and colon are mostly decompressed. No inflammatory changes although evaluation of the bowel wall is limited due to under distention. Appendix is normal. Vascular/Lymphatic: Aortic atherosclerosis. No enlarged abdominal or pelvic lymph nodes. Flattening of the inferior vena cava suggest hypovolemia. Reproductive: Uterus and bilateral adnexa are unremarkable. Other: No abdominal wall hernia or abnormality. No abdominopelvic ascites. Musculoskeletal: Schmorl's nodes at L2 and L3. Degenerative changes in the lumbar spine. Degenerative changes in the left hip. Postoperative right hip arthroplasty. No destructive bone lesions. IMPRESSION: 1. No acute process demonstrated in the abdomen or pelvis. No evidence of bowel obstruction or inflammation. 2. 5.5 mm nodule in the right middle lung. No follow-up  needed if patient is low-risk. Non-contrast chest CT can be considered in 12 months if patient is high-risk. This recommendation follows the consensus statement: Guidelines for Management of Incidental Pulmonary Nodules Detected on CT Images: From the Fleischner Society 2017; Radiology 2017; 284:228-243. 3. Aortic atherosclerosis. 4. Flattened IVC suggest  hypovolemia. Electronically Signed   By: Burman Nieves M.D.   On: 09/05/2017 04:20   Ct Head Wo Contrast  Result Date: 09/04/2017 CLINICAL DATA:  47 year old female with acute headache following fall yesterday. Initial encounter. EXAM: CT HEAD WITHOUT CONTRAST TECHNIQUE: Contiguous axial images were obtained from the base of the skull through the vertex without intravenous contrast. COMPARISON:  06/30/2016 temporal bone CT FINDINGS: Brain: No evidence of acute infarction, hemorrhage, hydrocephalus, extra-axial collection or mass lesion/mass effect. Vascular: No hyperdense vessel or unexpected calcification. Skull: No acute abnormality. Sinuses/Orbits: No acute abnormality. LEFT temporal/mastoid surgical changes again noted with LEFT mastoid effusion. Other: None IMPRESSION: 1. No evidence of acute intracranial abnormality Electronically Signed   By: Harmon Pier M.D.   On: 09/04/2017 11:45   Ct Chest W Contrast  Result Date: 09/05/2017 CLINICAL DATA:  Lung nodule EXAM: CT CHEST WITH CONTRAST TECHNIQUE: Multidetector CT imaging of the chest was performed during intravenous contrast administration. CONTRAST:  75mL OMNIPAQUE IOHEXOL 300 MG/ML  SOLN COMPARISON:  CT 09/05/2017, radiograph 09/05/2016 FINDINGS: Cardiovascular: Nonaneurysmal aorta. Coronary vascular calcification. Normal heart size. No significant pericardial effusion Mediastinum/Nodes: No enlarged mediastinal, hilar, or axillary lymph nodes. Thyroid gland, trachea, and esophagus demonstrate no significant findings. Lungs/Pleura: Minimal emphysema. 5 x 6 mm right middle lobe pulmonary nodule, average diameter 6 mm, series 7, image number 79. No pleural effusion. No pneumothorax Upper Abdomen: Steatosis Musculoskeletal: No chest wall abnormality. No acute or significant osseous findings. IMPRESSION: 1. 6 mm average diameter right middle lobe pulmonary nodule. Non-contrast chest CT at 6-12 months is recommended. If the nodule is stable at time of  repeat CT, then future CT at 18-24 months (from today's scan) is considered optional for low-risk patients, but is recommended for high-risk patients. This recommendation follows the consensus statement: Guidelines for Management of Incidental Pulmonary Nodules Detected on CT Images: From the Fleischner Society 2017; Radiology 2017; 284:228-243. 2. Minimal emphysema Emphysema (ICD10-J43.9). Electronically Signed   By: Jasmine Pang M.D.   On: 09/05/2017 22:28   Dg Chest Portable 1 View  Result Date: 09/05/2017 CLINICAL DATA:  Coffee-ground emesis. Sepsis. EXAM: PORTABLE CHEST 1 VIEW COMPARISON:  09/15/2013 FINDINGS: Shallow inspiration. Heart size and pulmonary vascularity are normal. No airspace disease or consolidation in the lungs. No blunting of costophrenic angles. No pneumothorax. Mediastinal contours appear intact. IMPRESSION: No active disease. Electronically Signed   By: Burman Nieves M.D.   On: 09/05/2017 02:37   Dg Abd Portable 2 Views  Result Date: 09/05/2017 CLINICAL DATA:  Coffee ground emesis. EXAM: PORTABLE ABDOMEN - 2 VIEW COMPARISON:  None. FINDINGS: Examination is technically limited due to under penetration. As visualized, there does not appear to be any significant large or small bowel distention although gas-filled bowel loops are present. No radiopaque stones are identified. No free intra-abdominal air. Postoperative changes in the right hip. IMPRESSION: Technically limited study. No gross evidence of bowel obstruction. Electronically Signed   By: Burman Nieves M.D.   On: 09/05/2017 02:38       Subjective: Patient is feeling better, no nausea or vomiting, no dyspnea or chest pain. Feeling weak and deconditioned.   Discharge Exam: Vitals:   09/15/17 2123 09/16/17 1914  BP: 122/80 101/68  Pulse: 96 80  Resp: (!) 27 18  Temp:  99 F (37.2 C)  SpO2: 96% 95%   Vitals:   09/15/17 0816 09/15/17 1114 09/15/17 2123 09/16/17 0417  BP: 121/68 126/80 122/80 101/68   Pulse: 85 78 96 80  Resp: 16 17 (!) 27 18  Temp:  98.4 F (36.9 C)  99 F (37.2 C)  TempSrc:  Oral Oral Oral  SpO2: 98% 93% 96% 95%  Weight:    67.1 kg  Height:        General: Not in pain or dyspnea, deconditioned  Neurology: Awake and alert, non focal  E ENT: no pallor, no icterus, oral mucosa moist Cardiovascular: No JVD. S1-S2 present, rhythmic, no gallops, rubs, or murmurs. No lower extremity edema. Pulmonary: vesicular breath sounds bilaterally, adequate air movement, no wheezing, rhonchi or rales. Gastrointestinal. Abdomen with no organomegaly, non tender, no rebound or guarding Skin. No rashes Musculoskeletal: no joint deformities   The results of significant diagnostics from this hospitalization (including imaging, microbiology, ancillary and laboratory) are listed below for reference.     Microbiology: Recent Results (from the past 240 hour(s))  Urine Culture     Status: None   Collection Time: 09/08/17  3:36 PM  Result Value Ref Range Status   Specimen Description URINE, CLEAN CATCH  Final   Special Requests   Final    NONE Performed at [facility], [address], [place], [place] [address]    Culture NO GROWTH  Final   Report Status 09/09/2017 FINAL  Final     Labs: BNP (last 3 results) Recent Labs    09/05/17 0701  BNP 465.9*   Basic Metabolic Panel: Recent Labs  Lab 09/12/17 0647 09/13/17 0636 09/14/17 0406 09/15/17 0037 09/16/17 0419  NA 141 139 135 135 138  K 4.2 4.1 4.0 3.6 3.2*  CL 105 99 96* 95* 104  CO2 23 27 27 26  21*  GLUCOSE 106* 99 103* 109* 100*  BUN 42* 49* 51* 60* 67*  CREATININE 4.84* 4.82* 4.39* 4.39* 4.13*  CALCIUM 9.1 9.5 9.8 9.8 8.4*  PHOS 4.9* 5.2* 6.4* 6.6* 5.0*   Liver Function Tests: Recent Labs  Lab 09/12/17 6045 09/13/17 0636 09/14/17 0406 09/15/17 0037 09/16/17 0419  ALBUMIN 2.2* 2.3* 2.4* 2.7* 2.7*   No results for input(s): LIPASE, AMYLASE in the last 168 hours. No results for input(s):  AMMONIA in the last 168 hours. CBC: Recent Labs  Lab 09/10/17 0259  WBC 8.8  NEUTROABS 6.4  HGB 10.1*  HCT 31.5*  MCV 91.6  PLT 256   Cardiac Enzymes: Recent Labs  Lab 09/13/17 2303 09/14/17 1448 09/15/17 0037  TROPONINI 0.03* <0.03 0.03*   BNP: Invalid input(s): POCBNP CBG: Recent Labs  Lab 09/15/17 1503 09/15/17 2008 09/16/17 0019 09/16/17 0452 09/16/17 0752  GLUCAP 93 99 98 102* 87   D-Dimer No results for input(s): DDIMER in the last 72 hours. Hgb A1c No results for input(s): HGBA1C in the last 72 hours. Lipid Profile No results for input(s): CHOL, HDL, LDLCALC, TRIG, CHOLHDL, LDLDIRECT in the last 72 hours. Thyroid function studies No results for input(s): TSH, T4TOTAL, T3FREE, THYROIDAB in the last 72 hours.  Invalid input(s): FREET3 Anemia work up Recent Labs    09/14/17 0406  FERRITIN 63  TIBC 231*  IRON 76   Urinalysis    Component Value Date/Time   COLORURINE STRAW (A) 09/08/2017 0802   APPEARANCEUR CLEAR 09/08/2017 0802   LABSPEC  1.003 (L) 09/08/2017 0802   PHURINE 7.0 09/08/2017 0802   GLUCOSEU NEGATIVE 09/08/2017 0802   HGBUR SMALL (A) 09/08/2017 0802   BILIRUBINUR NEGATIVE 09/08/2017 0802   KETONESUR NEGATIVE 09/08/2017 0802   PROTEINUR NEGATIVE 09/08/2017 0802   NITRITE NEGATIVE 09/08/2017 0802   LEUKOCYTESUR MODERATE (A) 09/08/2017 0802   Sepsis Labs Invalid input(s): PROCALCITONIN,  WBC,  LACTICIDVEN Microbiology Recent Results (from the past 240 hour(s))  Urine Culture     Status: None   Collection Time: 09/08/17  3:36 PM  Result Value Ref Range Status   Specimen Description URINE, CLEAN CATCH  Final   Special Requests   Final    NONE Performed at [facility], [address] [place], [place] [address]    Culture NO GROWTH  Final   Report Status 09/09/2017 FINAL  Final     Time coordinating discharge: 45 minutes  SIGNED:   Coralie KeensMauricio Daniel Azeez Dunker, MD  [facility] 09/16/2017, 10:08 AM Pager  [phone]  If 7PM-7AM, please contact night-coverage [email or web address] Password [email or web address]

## 2017-09-16 NOTE — Progress Notes (Signed)
Subjective: Interval History: no complaints today, eager to leave, no cp, SOB  Objective: Vital signs in last 24 hours: Temp:  [98.4 F (36.9 C)-99 F (37.2 C)] 99 F (37.2 C) (08/29 0417) Pulse Rate:  [78-96] 80 (08/29 0417) Resp:  [16-27] 18 (08/29 0417) BP: (101-126)/(68-80) 101/68 (08/29 0417) SpO2:  [93 %-98 %] 95 % (08/29 0417) Weight:  [67.1 kg] 67.1 kg (08/29 0417) Weight change: -4.468 kg  Intake/Output from previous day: 08/28 0701 - 08/29 0700 In: 1248.6 [P.O.:462; I.V.:786.6] Out: 2001 [Urine:2000; Stool:1] Intake/Output this shift: No intake/output data recorded.  Cardiac: normal rate and rhythm, clear s1 and s2 Pulmonary: CTAB, not in distress Abdominal: non distended abdomen, soft and nontender Extremities: no LE edema Mental status: Alert, conversant, in good spirits  Lab Results: No results for input(s): WBC, HGB, HCT, PLT in the last 72 hours. BMET:  Recent Labs    09/15/17 0037 09/16/17 0419  NA 135 138  K 3.6 3.2*  CL 95* 104  CO2 26 21*  GLUCOSE 109* 100*  BUN 60* 67*  CREATININE 4.39* 4.13*  CALCIUM 9.8 8.4*   No results for input(s): PTH in the last 72 hours. Iron Studies:  Recent Labs    09/14/17 0406  IRON 76  TIBC 231*  FERRITIN 63   CBG (last 3)  Recent Labs    09/16/17 0019 09/16/17 0452 09/16/17 0752  GLUCAP 98 102* 87     Studies/Results: No results found.  I have reviewed the patient's current medications.  Assessment/Plan: 1. Hyponatremia: resolved rapidly on admission with IV fluids.  Na starting to trend back down with polyuria, will implement fluid restriction 1200mL.  Na back up to 138 with NS and volume replacement   -continue fluid restriction   2. AKI: Baseline renal function normal, occurred in the setting of hypotension that was pressor requiring in the setting of polyuria during recovery of hyponatremia- hemodynamics have recovered but renal function slow to come around but seems to have plateaued at  cr of 4.8. Cr now trending down, output up in last 24 hours at 2L, not tachycardic, bp stable.     -continue fluid restriction -avoid all renal toxic medications -no IV contrast of any kind -continue to monitor bp closely   3. Normocytic anemia: uncertain etiology likely related to malnutrition, now nutritionist on board, getting regular meals with protein supplementation, vitamin and mineral supplements.  Folate and B12 normal on admission.  Iron studies suggestive of iron deficiency.   -continue oral iron     LOS: 11 days   Bailey Alexander 09/16/2017,7:58 AM

## 2017-09-16 NOTE — Progress Notes (Signed)
Cardiac monitor and IV D/C'd, patient has discharge orders. Waiting on family to pick up patient.

## 2017-09-16 NOTE — Plan of Care (Signed)
  Problem: Education: Goal: Knowledge of General Education information will improve Description Including pain rating scale, medication(s)/side effects and non-pharmacologic comfort measures Outcome: Adequate for Discharge   Problem: Health Behavior/Discharge Planning: Goal: Ability to manage health-related needs will improve Outcome: Adequate for Discharge   

## 2017-09-16 NOTE — Clinical Social Work Note (Addendum)
CSW facilitated patient discharge including contacting patient family and facility to confirm patient discharge plans. Clinical information faxed to facility and family agreeable with plan. Patient's sister will transport [facility]. She will arrive between 4:45-5:00. RN to call report prior to discharge [phone]). Left voicemail for APS worker.  CSW will sign off for now as social work intervention is no longer needed. Please consult us again if new needs arise.  Charlynn CourtSarah Rosamary Boudreau, CSW [phone]

## 2018-02-03 ENCOUNTER — Other Ambulatory Visit: Payer: Self-pay | Admitting: Internal Medicine

## 2018-02-10 ENCOUNTER — Other Ambulatory Visit: Payer: Self-pay | Admitting: Internal Medicine

## 2018-02-10 DIAGNOSIS — R921 Mammographic calcification found on diagnostic imaging of breast: Secondary | ICD-10-CM

## 2018-02-10 DIAGNOSIS — N6489 Other specified disorders of breast: Secondary | ICD-10-CM

## 2018-02-14 ENCOUNTER — Other Ambulatory Visit: Payer: Self-pay | Admitting: Internal Medicine

## 2018-02-17 ENCOUNTER — Ambulatory Visit
Admission: RE | Admit: 2018-02-17 | Discharge: 2018-02-17 | Disposition: A | Discharge status: Home / Self Care | Payer: Medicaid Other | Source: Ambulatory Visit | Attending: Internal Medicine | Admitting: Internal Medicine

## 2018-02-17 DIAGNOSIS — N6489 Other specified disorders of breast: Secondary | ICD-10-CM

## 2018-02-17 DIAGNOSIS — R921 Mammographic calcification found on diagnostic imaging of breast: Secondary | ICD-10-CM

## 2018-10-29 IMAGING — CT CT HEAD W/O CM
3 series · 16 of 46 positions shown, 19 images · non-contrast
Comparison: 06/30/2016 temporal bone CT

CLINICAL DATA: 46-year-old female with acute headache following
fall yesterday. Initial encounter.

EXAM:
CT HEAD WITHOUT CONTRAST
TECHNIQUE: Contiguous axial images were obtained from the base of the skull
through the vertex without intravenous contrast.

[Series 2: head trauma wo · axial · 0.42mm/px · z∈[+62,+182]mm · 10 of 29 slices shown, 13 images]
[im 3/29  brain]
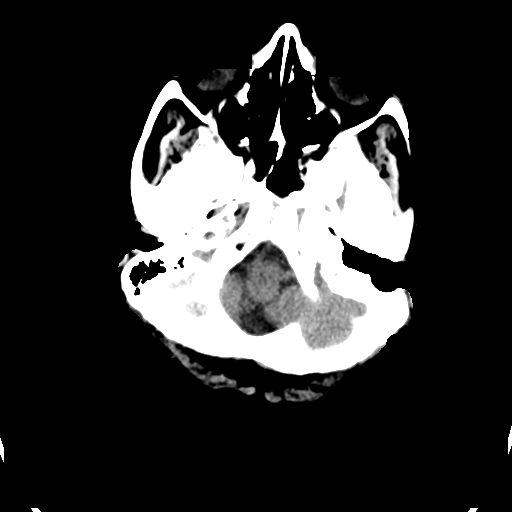
[im 3/29  bone]
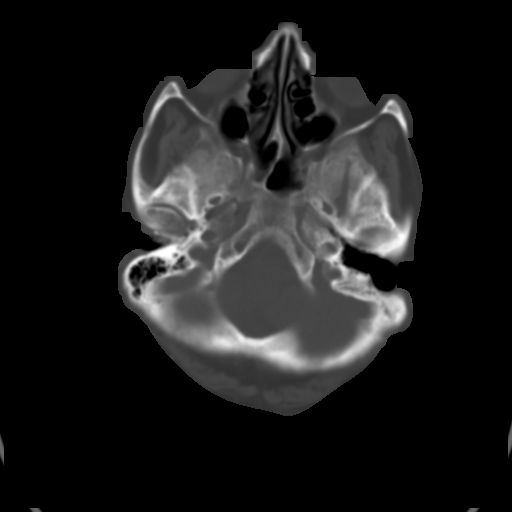
[im 6/29  brain]
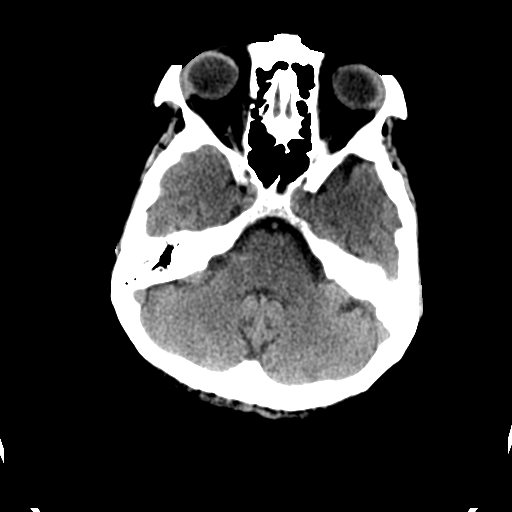
[im 8/29  brain]
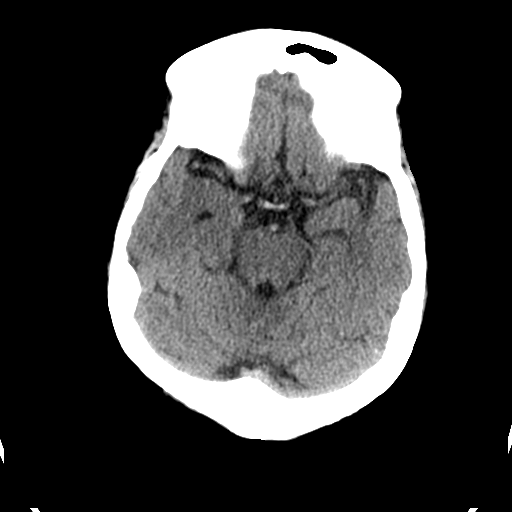
[im 11/29  brain]
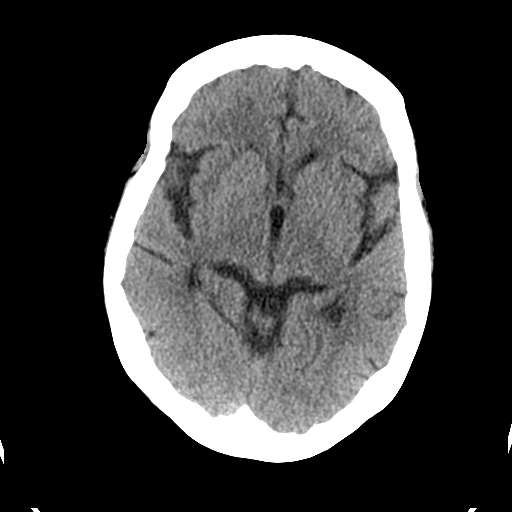
[im 14/29  brain]
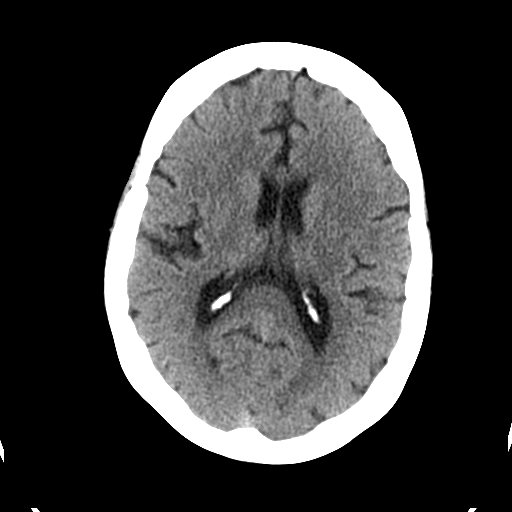
[im 14/29  bone]
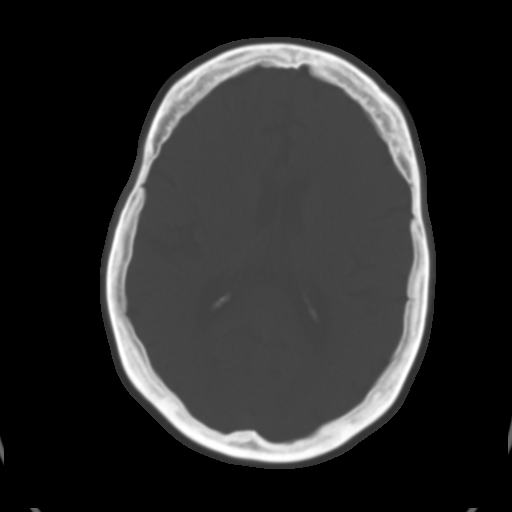
[im 16/29  brain]
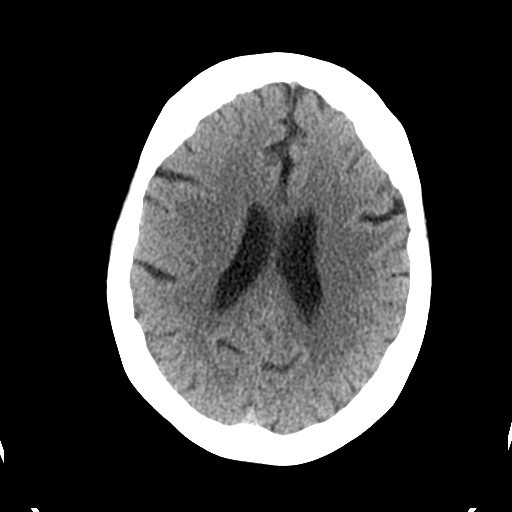
[im 19/29  brain]
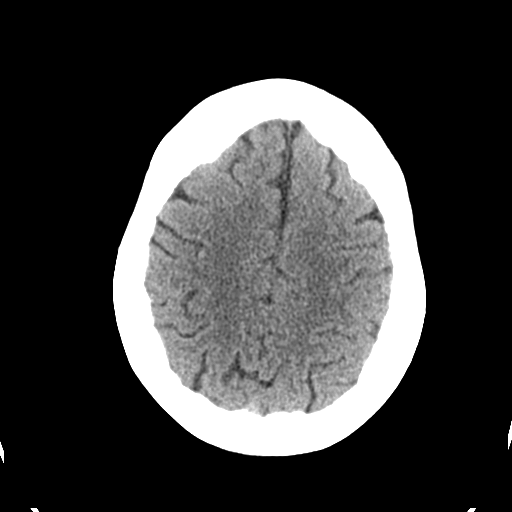
[im 22/29  brain]
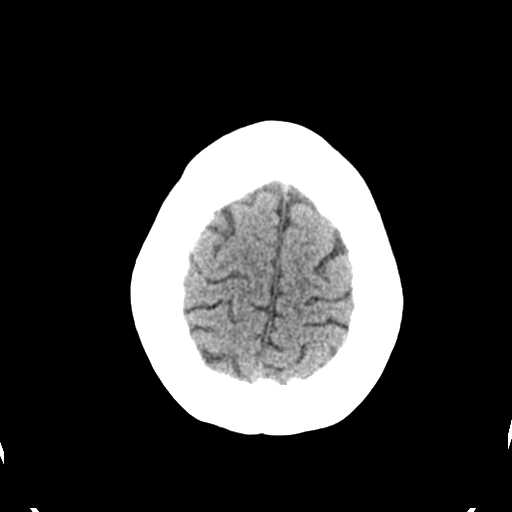
[im 24/29  brain]
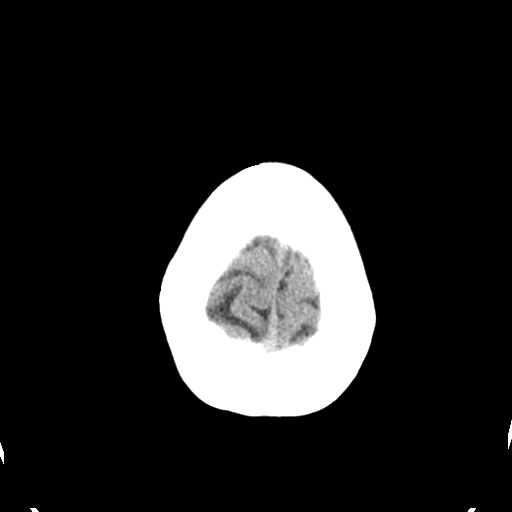
[im 24/29  bone]
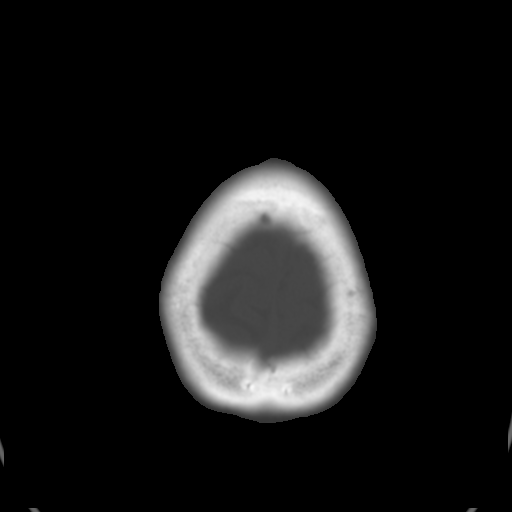
[im 27/29  brain]
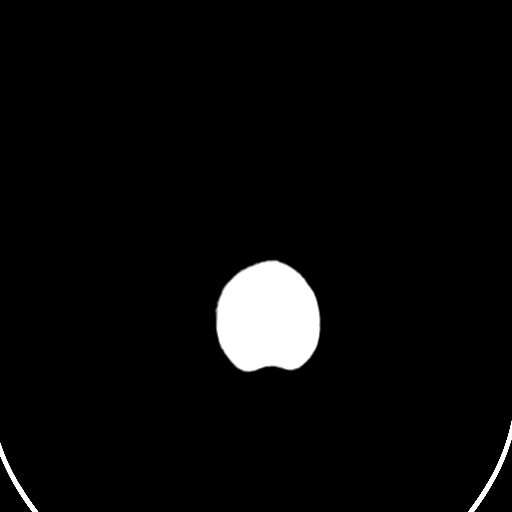

[Series 4: coronal soft tissue · coronal · 0.30mm/px · 3 of 70 slices shown]
[im 24/70  brain]
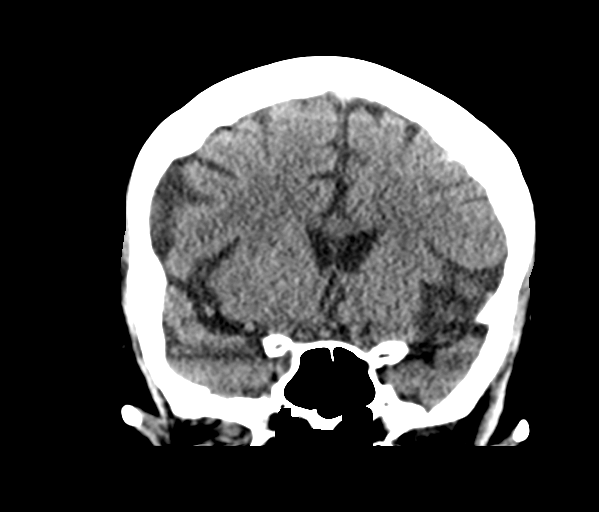
[im 31/70  brain]
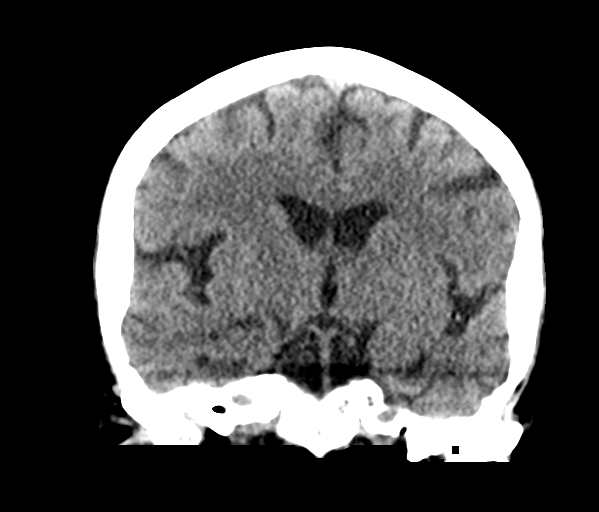
[im 39/70  brain]
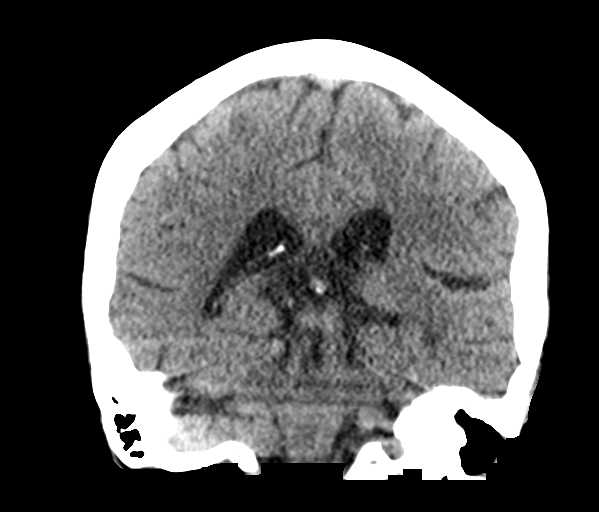

[Series 5: sagittal soft tissue · sagittal · 0.33mm/px · 3 of 59 slices shown]
[im 20/59  brain]
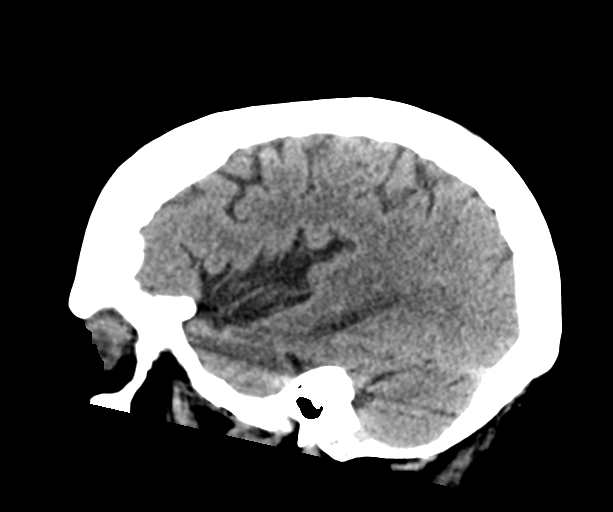
[im 30/59  brain]
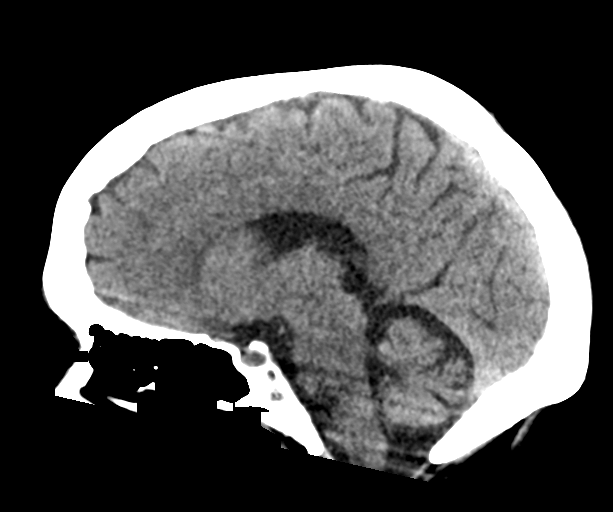
[im 39/59  brain]
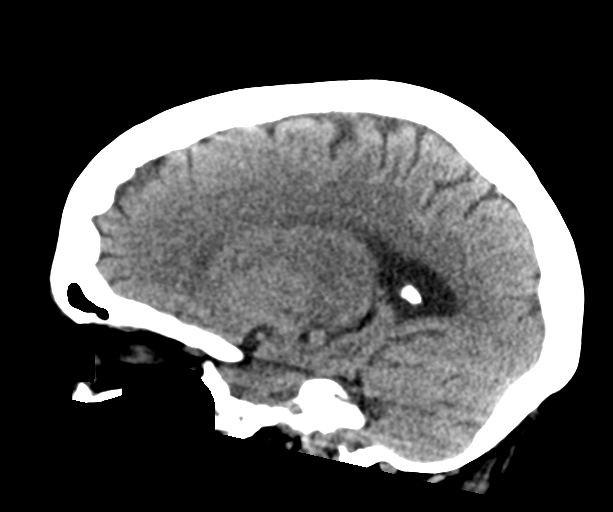

[16 of 46 positions shown; findings below may reference images not displayed]

FINDINGS: Brain: No evidence of acute infarction, hemorrhage, hydrocephalus,
extra-axial collection or mass lesion/mass effect.

Vascular: No hyperdense vessel or unexpected calcification.

Skull: No acute abnormality.

Sinuses/Orbits: No acute abnormality. LEFT temporal/mastoid surgical
changes again noted with LEFT mastoid effusion.

Other: None
IMPRESSION: 1. No evidence of acute intracranial abnormality

## 2018-10-30 IMAGING — CR DG ABD PORTABLE 2V
1 series · 2 of 2 positions shown · non-contrast
Comparison: None.

CLINICAL DATA: Coffee ground emesis.

EXAM:
PORTABLE ABDOMEN - 2 VIEW

[Series 1: supine ap · 0.17mm/px · 2 of 2 slices shown]
[im 1/2]
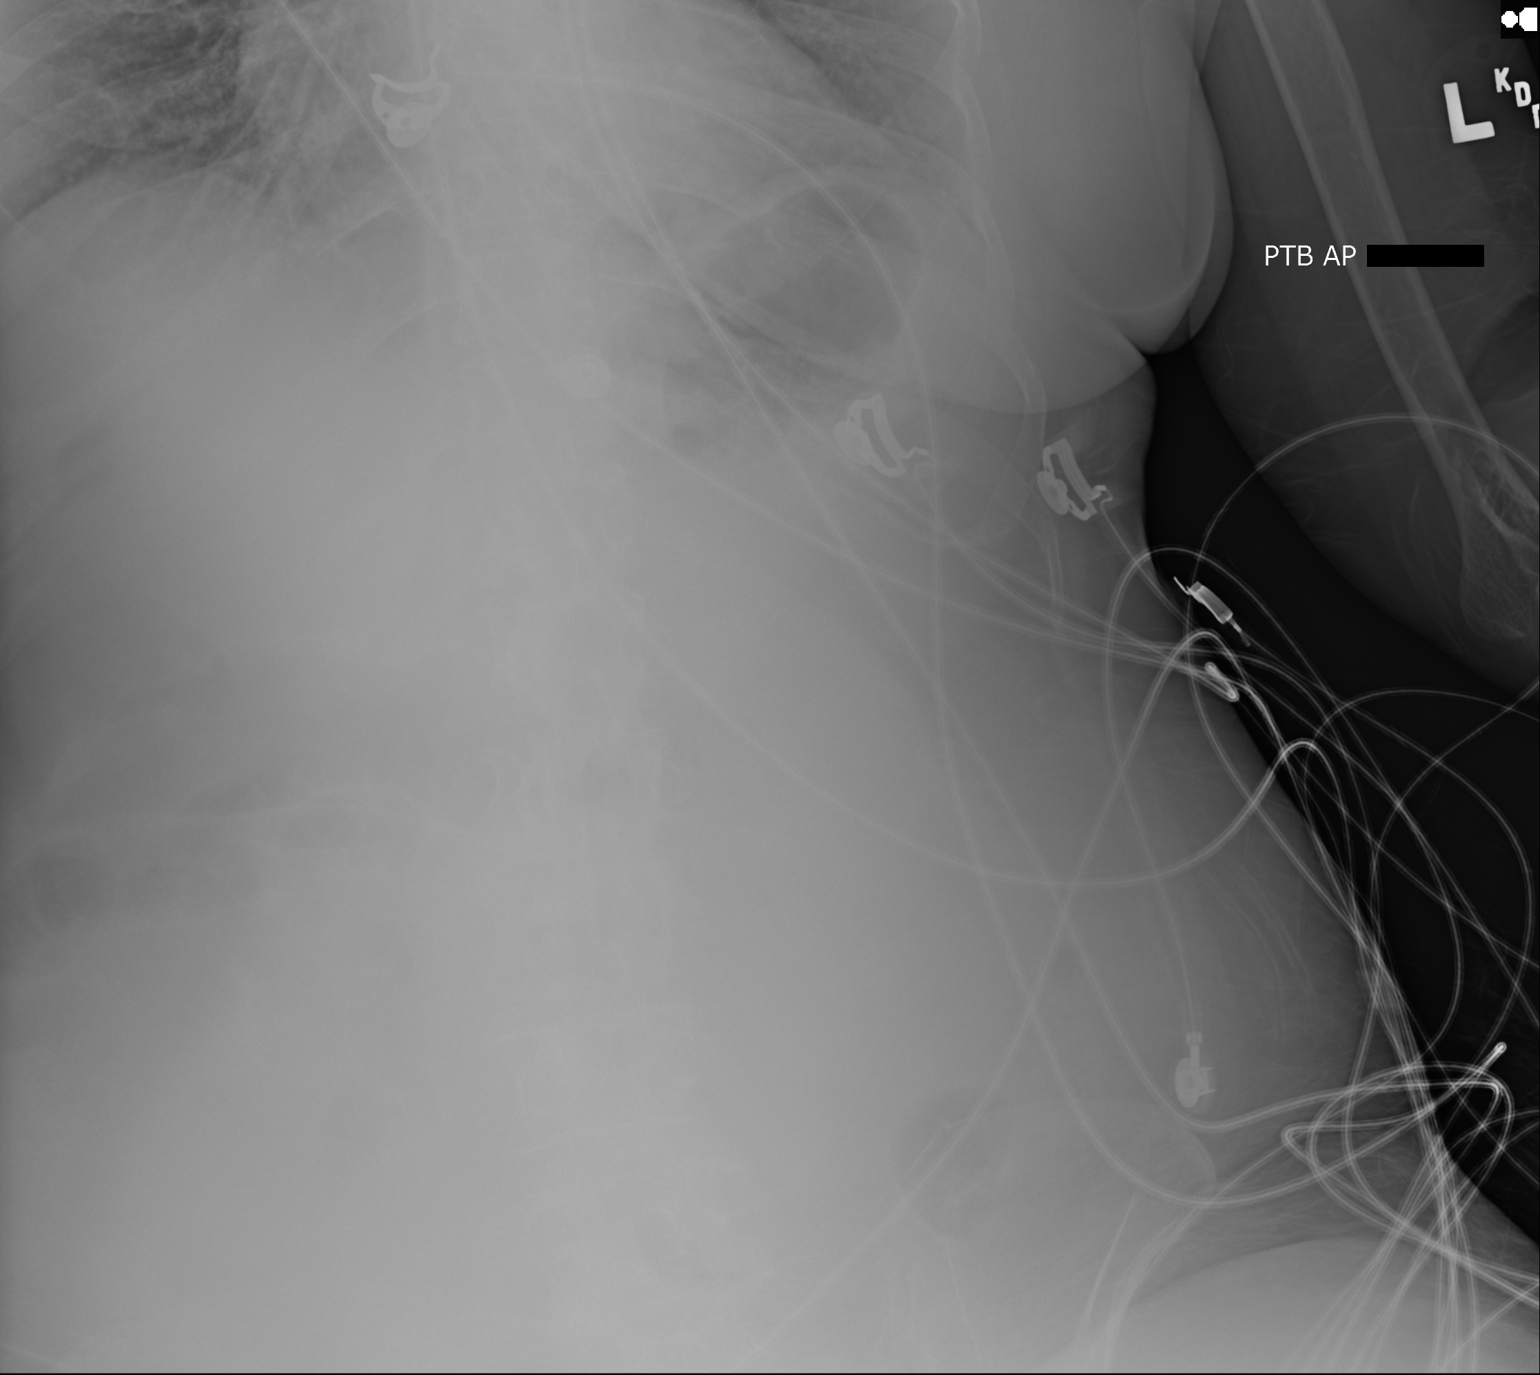
[im 2/2]
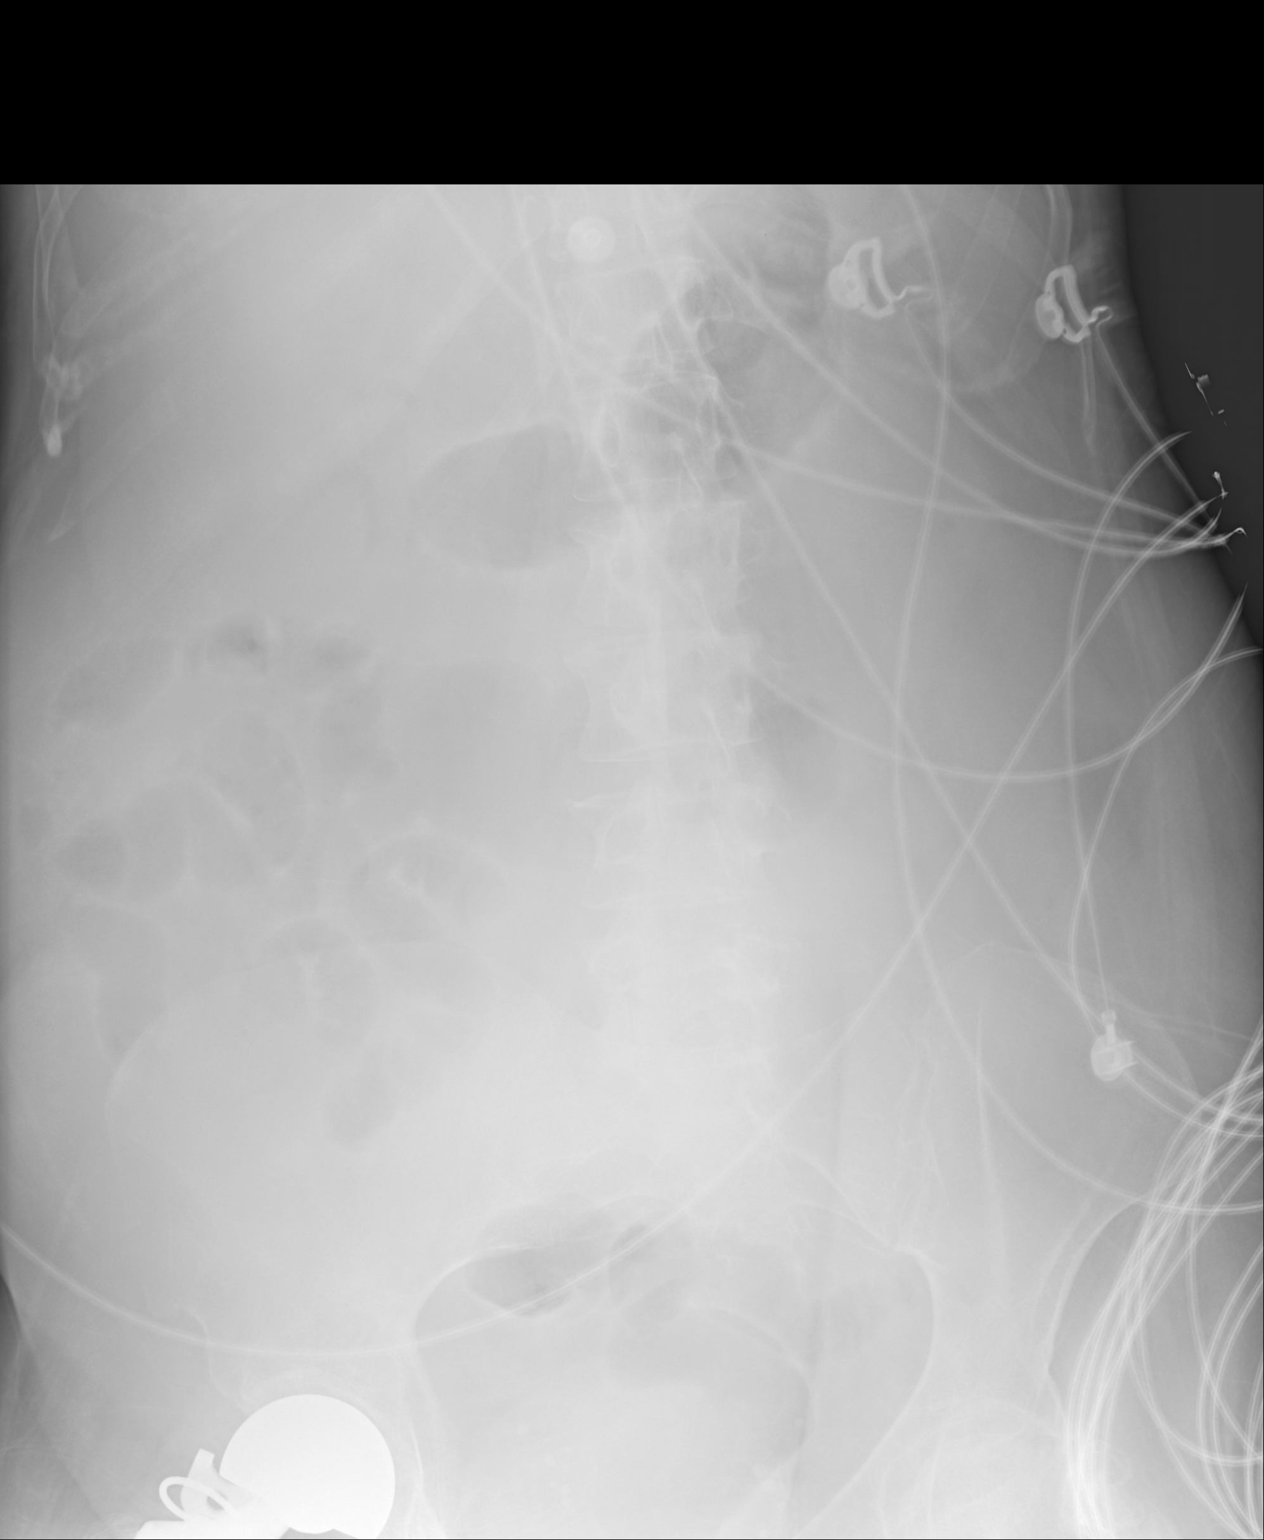

[2 of 2 positions shown; findings below may reference images not displayed]

FINDINGS: Examination is technically limited due to under penetration. As
visualized, there does not appear to be any significant large or
small bowel distention although gas-filled bowel loops are present.
No radiopaque stones are identified. No free intra-abdominal air.
Postoperative changes in the right hip.
IMPRESSION: Technically limited study. No gross evidence of bowel obstruction.

## 2018-10-30 IMAGING — CR DG CHEST 1V PORT
1 series · 1 of 1 positions shown · non-contrast
Comparison: 09/15/2013

CLINICAL DATA: Coffee-ground emesis. Sepsis.

EXAM:
PORTABLE CHEST 1 VIEW

[portable]
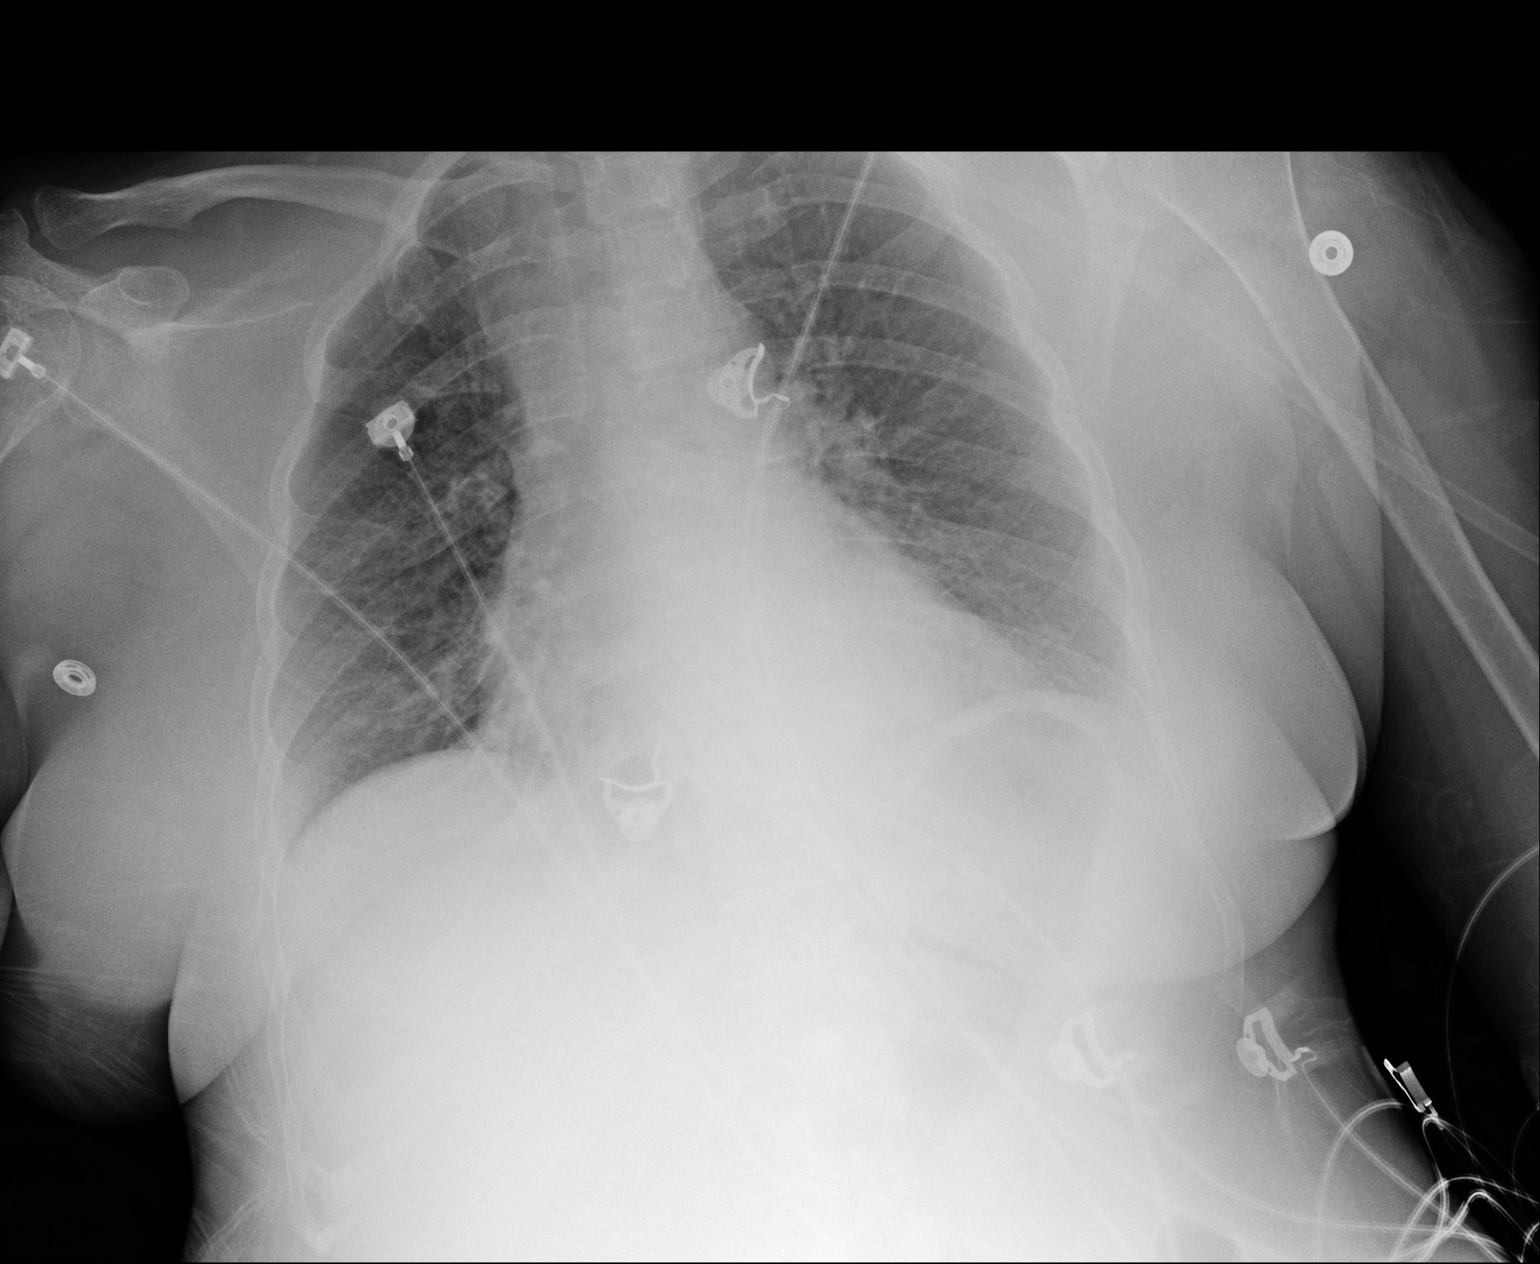

[1 of 1 positions shown; findings below may reference images not displayed]

FINDINGS: Shallow inspiration. Heart size and pulmonary vascularity are
normal. No airspace disease or consolidation in the lungs. No
blunting of costophrenic angles. No pneumothorax. Mediastinal
contours appear intact.
IMPRESSION: No active disease.

## 2019-04-10 ENCOUNTER — Other Ambulatory Visit: Payer: Self-pay | Admitting: Internal Medicine

## 2019-04-10 DIAGNOSIS — R921 Mammographic calcification found on diagnostic imaging of breast: Secondary | ICD-10-CM

## 2019-05-03 ENCOUNTER — Other Ambulatory Visit: Payer: Self-pay

## 2019-05-03 ENCOUNTER — Emergency Department (HOSPITAL_COMMUNITY)
Admission: EM | Admit: 2019-05-03 | Discharge: 2019-05-04 | Disposition: A | Discharge status: Skilled Nursing Facility | Payer: Medicaid Other | Attending: Emergency Medicine | Admitting: Emergency Medicine

## 2019-05-03 ENCOUNTER — Encounter (HOSPITAL_COMMUNITY): Payer: Self-pay

## 2019-05-03 DIAGNOSIS — F1097 Alcohol use, unspecified with alcohol-induced persisting dementia: Secondary | ICD-10-CM | POA: Insufficient documentation

## 2019-05-03 DIAGNOSIS — E119 Type 2 diabetes mellitus without complications: Secondary | ICD-10-CM | POA: Insufficient documentation

## 2019-05-03 DIAGNOSIS — Z87891 Personal history of nicotine dependence: Secondary | ICD-10-CM | POA: Insufficient documentation

## 2019-05-03 DIAGNOSIS — R4689 Other symptoms and signs involving appearance and behavior: Secondary | ICD-10-CM

## 2019-05-03 DIAGNOSIS — Z79899 Other long term (current) drug therapy: Secondary | ICD-10-CM | POA: Insufficient documentation

## 2019-05-03 DIAGNOSIS — R443 Hallucinations, unspecified: Secondary | ICD-10-CM

## 2019-05-03 DIAGNOSIS — Z20822 Contact with and (suspected) exposure to covid-19: Secondary | ICD-10-CM | POA: Diagnosis not present

## 2019-05-03 DIAGNOSIS — E039 Hypothyroidism, unspecified: Secondary | ICD-10-CM | POA: Insufficient documentation

## 2019-05-03 DIAGNOSIS — F918 Other conduct disorders: Secondary | ICD-10-CM | POA: Diagnosis present

## 2019-05-03 LAB — CBC WITH DIFFERENTIAL/PLATELET
Abs Immature Granulocytes: 0.06 10*3/uL (ref 0.00–0.07)
Basophils Absolute: 0 10*3/uL (ref 0.0–0.1)
Basophils Relative: 0 %
Eosinophils Absolute: 0.2 10*3/uL (ref 0.0–0.5)
Eosinophils Relative: 3 %
HCT: 44.1 % (ref 36.0–46.0)
Hemoglobin: 14 g/dL (ref 12.0–15.0)
Immature Granulocytes: 1 %
Lymphocytes Relative: 16 %
Lymphs Abs: 1.4 10*3/uL (ref 0.7–4.0)
MCH: 28.5 pg (ref 26.0–34.0)
MCHC: 31.7 g/dL (ref 30.0–36.0)
MCV: 89.6 fL (ref 80.0–100.0)
Monocytes Absolute: 0.6 10*3/uL (ref 0.1–1.0)
Monocytes Relative: 6 %
Neutro Abs: 6.7 10*3/uL (ref 1.7–7.7)
Neutrophils Relative %: 74 %
Platelets: 190 10*3/uL (ref 150–400)
RBC: 4.92 MIL/uL (ref 3.87–5.11)
RDW: 15.3 % (ref 11.5–15.5)
WBC: 9 10*3/uL (ref 4.0–10.5)
nRBC: 0 % (ref 0.0–0.2)

## 2019-05-03 LAB — URINALYSIS, ROUTINE W REFLEX MICROSCOPIC
Bilirubin Urine: NEGATIVE
Glucose, UA: NEGATIVE mg/dL
Hgb urine dipstick: NEGATIVE
Ketones, ur: NEGATIVE mg/dL
Nitrite: NEGATIVE
Protein, ur: NEGATIVE mg/dL
Specific Gravity, Urine: 1.008 (ref 1.005–1.030)
pH: 9 — ABNORMAL HIGH (ref 5.0–8.0)

## 2019-05-03 LAB — COMPREHENSIVE METABOLIC PANEL
ALT: 25 U/L (ref 0–44)
AST: 24 U/L (ref 15–41)
Albumin: 4 g/dL (ref 3.5–5.0)
Alkaline Phosphatase: 67 U/L (ref 38–126)
Anion gap: 9 (ref 5–15)
BUN: 14 mg/dL (ref 6–20)
CO2: 27 mmol/L (ref 22–32)
Calcium: 9.4 mg/dL (ref 8.9–10.3)
Chloride: 105 mmol/L (ref 98–111)
Creatinine, Ser: 0.94 mg/dL (ref 0.44–1.00)
GFR calc Af Amer: >60 mL/min (ref >60)
GFR calc non Af Amer: >60 mL/min (ref >60)
Glucose, Bld: 103 mg/dL — ABNORMAL HIGH (ref 70–99)
Potassium: 4.2 mmol/L (ref 3.5–5.1)
Sodium: 141 mmol/L (ref 135–145)
Total Bilirubin: 0.6 mg/dL (ref 0.3–1.2)
Total Protein: 7.1 g/dL (ref 6.5–8.1)

## 2019-05-03 LAB — RAPID URINE DRUG SCREEN, HOSP PERFORMED
Amphetamines: NOT DETECTED
Barbiturates: NOT DETECTED
Benzodiazepines: POSITIVE — AB
Cocaine: NOT DETECTED
Opiates: NOT DETECTED
Tetrahydrocannabinol: NOT DETECTED

## 2019-05-03 LAB — RESPIRATORY PANEL BY RT PCR (FLU A&B, COVID)
Influenza A by PCR: NEGATIVE
Influenza B by PCR: NEGATIVE
SARS Coronavirus 2 by RT PCR: NEGATIVE

## 2019-05-03 LAB — ETHANOL: Alcohol, Ethyl (B): <10 mg/dL (ref <10)

## 2019-05-03 MED ORDER — CALCIUM CARBONATE-VITAMIN D 500-200 MG-UNIT PO TABS
1.0000 | ORAL_TABLET | Freq: Every day | ORAL | Status: DC
  Administered 2019-05-04: 1 via ORAL
  Filled 2019-05-03 (×3): qty 1

## 2019-05-03 MED ORDER — NITROGLYCERIN 0.4 MG SL SUBL: 0.4000 mg | SUBLINGUAL_TABLET | SUBLINGUAL | Status: DC | PRN

## 2019-05-03 MED ORDER — LORATADINE 10 MG PO TABS
10.0000 mg | ORAL_TABLET | Freq: Every day | ORAL | Status: DC
  Administered 2019-05-04: 10 mg via ORAL
  Filled 2019-05-03: qty 1

## 2019-05-03 MED ORDER — ACETAMINOPHEN 500 MG PO TABS: 500.0000 mg | ORAL_TABLET | Freq: Three times a day (TID) | ORAL | Status: DC | PRN

## 2019-05-03 MED ORDER — SERTRALINE HCL 50 MG PO TABS
25.0000 mg | ORAL_TABLET | Freq: Every day | ORAL | Status: DC
  Administered 2019-05-04: 25 mg via ORAL
  Filled 2019-05-03: qty 1

## 2019-05-03 MED ORDER — LEVOTHYROXINE SODIUM 50 MCG PO TABS
50.0000 ug | ORAL_TABLET | Freq: Every day | ORAL | Status: DC
  Administered 2019-05-04: 50 ug via ORAL
  Filled 2019-05-03: qty 1

## 2019-05-03 MED ORDER — ALPRAZOLAM 0.5 MG PO TABS
0.2500 mg | ORAL_TABLET | ORAL | Status: DC
  Administered 2019-05-04: 0.25 mg via ORAL
  Filled 2019-05-03: qty 1

## 2019-05-03 MED ORDER — TRAZODONE HCL 50 MG PO TABS
50.0000 mg | ORAL_TABLET | Freq: Every day | ORAL | Status: DC
  Administered 2019-05-03: 50 mg via ORAL
  Filled 2019-05-03: qty 1

## 2019-05-03 NOTE — ED Triage Notes (Addendum)
Pt from [facility]. She has been threatening other residents in the facility. They stated she has been having hallucinations, delusions. They also state she is manic, has paranoia, hypersexuality. Pt is not SI/HI. She sees a psychiatrist at the facility. Pt states she doesn't want to go back to [facility].

## 2019-05-03 NOTE — ED Notes (Signed)
Pt in bed, pt reports abd pain, abd soft, non tender with bowel sounds, pt denies si or hi at this time.

## 2019-05-03 NOTE — ED Notes (Signed)
Pt changed into red scrubs, secured belongings, pt states that she took her daily meds at the facility before she left.
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
## 2019-05-03 NOTE — ED Provider Notes (Signed)
[facility] EMERGENCY DEPARTMENT Provider Note   CSN: [id number] Arrival date & time: 05/03/19  1245     History Chief Complaint  Patient presents with  . Medical Clearance    Bailey Alexander is a 49 y.o. female who resides and [facility] secondary to etoh induced dementia, also with history of DM, hypothyroidism presenting from [facility], her SNF facility for evaluation of increasing aggression toward other residents in the home in addition to reported hallucinations in addition to being especially manic with paranoia recently.  She denies any auditory/visual hallucinations, also denies SI/HI or any inappropriate behavior toward others around her.  She is not desirous of returning to [facility] as they make her go to bed at 7pm and she wants to live in her own home.  She does report having abdominal distention and cramping, no n/v/d. Unable to determine the length of time of sx, denies dysuria.  She is postmenopausal per chart. Last ate just prior to arrival which does not affect her symptoms.    The history is provided by the patient, the nursing home and medical records.       Past Medical History:  Diagnosis Date  . Bulimia   . Dementia ([place]) 10/2015  . Thyroid disease     Patient Active Problem List   Diagnosis Date Noted  . Hyponatremia 09/05/2017  . Hypokalemia 09/05/2017  . Hypomagnesemia 09/05/2017  . Cardiac enzymes elevated 09/05/2017  . Pulmonary nodule 09/05/2017  . Debility 02/11/2016  . Malnutrition ([place]) 01/31/2016  . Anorexia nervosa 01/31/2016  . Acquired hypothyroidism 01/31/2016  . Dementia ([place]) 10/20/2015    Past Surgical History:  Procedure Laterality Date  . arm surgery Right   . 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     Family History  Problem Relation Age of Onset  . Arthritis Mother   . COPD Mother   . Cancer Mother        Veatrice Bourbon Cancer  . Depression Mother         Bipolar  . Diabetes Mother   . Hearing loss Mother   . Heart disease Mother   . Hyperlipidemia Mother   . Hypertension Mother   . Kidney disease Mother   . Miscarriages / [place] Mother   . Arthritis Father   . Asthma Father   . Cancer Father        prostate  . Arthritis Sister   . Breast cancer Sister 59  . Stroke Maternal Grandmother   . Arthritis Paternal Grandmother   . Heart disease Paternal Grandmother   . Heart disease Paternal Grandfather   . Miscarriages / [place] Sister     Social History   Tobacco Use  . Smoking status: Former Smoker    Types: Cigarettes    Quit date: 07/31/2015    Years since quitting: 3.7  . Smokeless tobacco: Never Used  Substance Use Topics  . Alcohol use: Not Currently  . Drug use: No    Home Medications Prior to Admission medications   Medication Sig Start Date End Date Taking? Authorizing Provider  acetaminophen (TYLENOL) 500 MG tablet Take 500 mg by mouth every 8 (eight) hours as needed for mild pain.    [provider]  ALPRAZolam Duanne Moron) 0.25 MG tablet Take 1 tablet (0.25 mg total) by mouth every morning. 09/16/17   Arrien, Jimmy Picket, MD  Calcium Carbonate-Vitamin D (CALCIUM HIGH  POTENCY/VITAMIN D) 600-200 MG-UNIT TABS Take 1 tablet by mouth daily. 04/13/17   [provider]  feeding supplement (BOOST / RESOURCE BREEZE) LIQD Take 90 mLs by mouth 2 (two) times daily.    [provider]  levothyroxine (SYNTHROID, LEVOTHROID) 50 MCG tablet Take 1 tablet (50 mcg total) by mouth daily before breakfast. 02/02/16   Mechele Claude, MD  loratadine (CLARITIN) 10 MG tablet Take 10 mg by mouth daily.    [provider]  Menthol-Zinc Oxide 0.44-20.6 % OINT Apply 1 application topically 3 (three) times daily. Apply to the buttocks    [provider]  nitroGLYCERIN (NITROSTAT) 0.4 MG SL tablet Place 0.4 mg under the tongue every 5 (five) minutes as needed for chest pain.    [provider]  sertraline (ZOLOFT) 25 MG tablet Take 25 mg by mouth daily.    [provider]  traZODone (DESYREL) 50 MG tablet Take 50 mg by mouth at bedtime. 03/24/17   [provider]    Allergies    Abilify [aripiprazole]  Review of Systems   Review of Systems  Constitutional: Negative for fever.  HENT: Negative for congestion and sore throat.   Eyes: Negative.   Respiratory: Negative for chest tightness and shortness of breath.   Cardiovascular: Negative for chest pain.  Gastrointestinal: Positive for abdominal distention and abdominal pain. Negative for diarrhea, nausea and vomiting.  Genitourinary: Negative.  Negative for dysuria.  Musculoskeletal: Negative for arthralgias, joint swelling and neck pain.  Skin: Negative.  Negative for rash and wound.  Neurological: Negative for dizziness, weakness, light-headedness, numbness and headaches.  Psychiatric/Behavioral: Positive for agitation, behavioral problems and hallucinations. Negative for self-injury and suicidal ideas.    Physical Exam Updated Vital Signs BP (!) 123/95   Pulse 82   Temp 98.6 F (37 C) (Oral)   Resp 17   Ht 5\' 5"  (1.651 m)   Wt 89.8 kg   LMP  (LMP Unknown)   SpO2 97%   BMI 32.95 kg/m   Physical Exam Vitals and nursing note reviewed.  Constitutional:      Appearance: She is well-developed.  HENT:     Head: Normocephalic and atraumatic.     Mouth/Throat:     Mouth: Mucous membranes are moist.  Eyes:     Conjunctiva/sclera: Conjunctivae normal.  Cardiovascular:     Rate and Rhythm: Normal rate and regular rhythm.     Heart sounds: Normal heart sounds.  Pulmonary:     Effort: Pulmonary effort is normal.     Breath sounds: Normal breath sounds. No wheezing.  Abdominal:     General: Abdomen is protuberant. Bowel sounds are normal. There is no distension.     Palpations: Abdomen is soft.     Tenderness: There is no abdominal tenderness.     Comments: Abdominal exam is  protuberant without firm distention.  Abdomen is soft and nontender to palpation without guarding.  Normal abdominal exam with no acute findings.  Musculoskeletal:        General: Normal range of motion.     Cervical back: Normal range of motion.  Skin:    General: Skin is warm and dry.  Neurological:     Mental Status: She is alert.     ED Results / Procedures / Treatments   Labs (all labs ordered are listed, but only abnormal results are displayed) Labs Reviewed  COMPREHENSIVE METABOLIC PANEL - Abnormal; Notable for the following components:      Result Value  Glucose, Bld 103 (*)    All other components within normal limits  RAPID URINE DRUG SCREEN, HOSP PERFORMED - Abnormal; Notable for the following components:   Benzodiazepines POSITIVE (*)    All other components within normal limits  URINALYSIS, ROUTINE W REFLEX MICROSCOPIC - Abnormal; Notable for the following components:   pH 9.0 (*)    Leukocytes,Ua TRACE (*)    Bacteria, UA RARE (*)    All other components within normal limits  RESPIRATORY PANEL BY RT PCR (FLU A&B, COVID)  ETHANOL  CBC WITH DIFFERENTIAL/PLATELET    EKG None  Radiology No results found.  Procedures Procedures (including critical care time)  Medications Ordered in ED Medications  acetaminophen (TYLENOL) tablet 500 mg (has no administration in time range)  ALPRAZolam (XANAX) tablet 0.25 mg (has no administration in time range)  calcium-vitamin D (OSCAL WITH D) 500-200 MG-UNIT per tablet 1 tablet (has no administration in time range)  levothyroxine (SYNTHROID) tablet 50 mcg (has no administration in time range)  loratadine (CLARITIN) tablet 10 mg (has no administration in time range)  nitroGLYCERIN (NITROSTAT) SL tablet 0.4 mg (has no administration in time range)  sertraline (ZOLOFT) tablet 25 mg (has no administration in time range)  traZODone (DESYREL) tablet 50 mg (has no administration in time range)    ED Course  I have reviewed  the triage vital signs and the nursing notes.  Pertinent labs & imaging results that were available during my care of the patient were reviewed by me and considered in my medical decision making (see chart for details).    MDM Rules/Calculators/A&P                      Labs reviewed, stable without any significant ab normality's.  Her exam is benign, specifically she has no abdominal pain on exam, no distention or mass, no guarding.  Given normal labs and and abdominal exam, no further abdominal evaluation indicated.  She is medically cleared for evaluation by TTS.  This order has been placed.  Psych holding orders were also placed.  Of note several attempts were made to contact patient's legal guardian Donney Dice who is her sister.  Attempts went directly to voicemail. Final Clinical Impression(s) / ED Diagnoses Final diagnoses:  Aggression  Hallucination    Rx / DC Orders ED Discharge Orders    None       Victoriano Lain 05/03/19 1735    Pricilla Loveless, MD 05/06/19 [phone]

## 2019-05-04 MED ORDER — QUETIAPINE FUMARATE 25 MG PO TABS: 25.0000 mg | ORAL_TABLET | Freq: Two times a day (BID) | ORAL | 0 refills | Status: AC

## 2019-05-04 NOTE — ED Notes (Signed)
Called for EMS to transport Pt back to [facility].

## 2019-05-04 NOTE — ED Provider Notes (Signed)
Emergency Medicine Observation Re-evaluation Note  Bailey Alexander is a 49 y.o. female, seen on rounds today.  Pt initially presented to the ED for complaints of Medical Clearance Currently, the patient is have TTS evaluate in the AM.  Physical Exam  BP 103/62 (BP Location: Right Arm)   Pulse 65   Temp 98.3 F (36.8 C) (Oral)   Resp 16   Ht 5\' 5"  (1.651 m)   Wt 89.8 kg   LMP  (LMP Unknown)   SpO2 95%   BMI 32.95 kg/m  Physical Exam Resting comfortably. No acute distress.  Even unlabored respirations  ED Course / MDM  EKG:    I have reviewed the labs performed to date as well as medications administered while in observation.  Recent changes in the last 24 hours include, none. Plan  Current plan is for EKG and Seroquel this AM. I reviewed the TTS note from yesterday. In the body of the text there is a recommendation to obtain and EKG for QT evaluation and if normal to start Seroquel 25 mg BID. This does not appear to have been completed overnight. Have ordered EKG this AM.   8:19 AM  EKG Interpretation  Date/Time:  Thursday May 04 2019 08:12:39 EDT Ventricular Rate:  67 PR Interval:  168 QRS Duration: 72 QT Interval:  440 QTC Calculation: 464 R Axis:   -4 Text Interpretation: Normal sinus rhythm Low voltage QRS Nonspecific T wave abnormality Prolonged QT Abnormal ECG No STEMI Confirmed by 09-27-1998 [phone]) on 05/04/2019 8:19:10 AM      QTc: 464 confirmed manually.   Defer decision to start Seroquel to TTS team given this EKG and QTc finding.   11:32 AM Discussed the case with the nurse practitioner, 05/06/2019, NP, after reevaluation of patient this morning.  We discussed the EKG findings and specific QTC number.  They advised going ahead with starting Seroquel as prescribed.  We will send patient back to [facility].  I independently reevaluated the patient and she is calm and cooperative.  Will discharge at this time.    [facility], MD 05/04/19 1133
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
## 2019-05-04 NOTE — Discharge Instructions (Signed)
You were seen in the emergency department today with increased aggression.  We are starting a new medication Seroquel.  Please take as directed and call your primary care doctor.  Return to the emergency department any new or suddenly worsening symptoms.

## 2019-06-12 ENCOUNTER — Encounter (HOSPITAL_COMMUNITY): Payer: Self-pay

## 2019-06-12 ENCOUNTER — Other Ambulatory Visit: Payer: Self-pay

## 2019-06-12 ENCOUNTER — Emergency Department (HOSPITAL_COMMUNITY)
Admission: EM | Admit: 2019-06-12 | Discharge: 2019-06-13 | Disposition: A | Discharge status: Home / Self Care | Payer: Medicaid Other | Attending: Emergency Medicine | Admitting: Emergency Medicine

## 2019-06-12 DIAGNOSIS — R456 Violent behavior: Secondary | ICD-10-CM | POA: Diagnosis present

## 2019-06-12 DIAGNOSIS — E039 Hypothyroidism, unspecified: Secondary | ICD-10-CM | POA: Diagnosis not present

## 2019-06-12 DIAGNOSIS — Z87891 Personal history of nicotine dependence: Secondary | ICD-10-CM | POA: Insufficient documentation

## 2019-06-12 DIAGNOSIS — F0390 Unspecified dementia without behavioral disturbance: Secondary | ICD-10-CM | POA: Insufficient documentation

## 2019-06-12 DIAGNOSIS — R4689 Other symptoms and signs involving appearance and behavior: Secondary | ICD-10-CM

## 2019-06-12 DIAGNOSIS — E119 Type 2 diabetes mellitus without complications: Secondary | ICD-10-CM | POA: Insufficient documentation

## 2019-06-12 DIAGNOSIS — Z96643 Presence of artificial hip joint, bilateral: Secondary | ICD-10-CM | POA: Insufficient documentation

## 2019-06-12 DIAGNOSIS — Z79899 Other long term (current) drug therapy: Secondary | ICD-10-CM | POA: Insufficient documentation

## 2019-06-12 HISTORY — DX: Type 2 diabetes mellitus without complications: E11.9

## 2019-06-12 HISTORY — DX: Vitamin D deficiency, unspecified: E55.9

## 2019-06-12 HISTORY — DX: Anxiety disorder, unspecified: F41.9

## 2019-06-12 HISTORY — DX: Deficiency of other specified B group vitamins: E53.8

## 2019-06-12 HISTORY — DX: Peripheral vascular disease, unspecified: I73.9

## 2019-06-12 HISTORY — DX: Facial myokymia: G51.4

## 2019-06-12 HISTORY — DX: Anemia, unspecified: D64.9

## 2019-06-12 HISTORY — DX: Alcohol dependence, uncomplicated: F10.20

## 2019-06-12 HISTORY — DX: Major depressive disorder, single episode, unspecified: F32.9

## 2019-06-12 HISTORY — DX: Adjustment disorder with disturbance of conduct: F43.24

## 2019-06-12 HISTORY — DX: Psychotic disorder with delusions due to known physiological condition: F06.2

## 2019-06-12 HISTORY — DX: Hallucinations, unspecified: R44.3

## 2019-06-12 HISTORY — DX: Hyperlipidemia, unspecified: E78.5

## 2019-06-12 LAB — COMPREHENSIVE METABOLIC PANEL
ALT: 23 U/L (ref 0–44)
AST: 25 U/L (ref 15–41)
Albumin: 4.1 g/dL (ref 3.5–5.0)
Alkaline Phosphatase: 66 U/L (ref 38–126)
Anion gap: 8 (ref 5–15)
BUN: 20 mg/dL (ref 6–20)
CO2: 25 mmol/L (ref 22–32)
Calcium: 9.3 mg/dL (ref 8.9–10.3)
Chloride: 106 mmol/L (ref 98–111)
Creatinine, Ser: 0.9 mg/dL (ref 0.44–1.00)
GFR calc Af Amer: >60 mL/min (ref >60)
GFR calc non Af Amer: >60 mL/min (ref >60)
Glucose, Bld: 88 mg/dL (ref 70–99)
Potassium: 4 mmol/L (ref 3.5–5.1)
Sodium: 139 mmol/L (ref 135–145)
Total Bilirubin: 0.9 mg/dL (ref 0.3–1.2)
Total Protein: 7.2 g/dL (ref 6.5–8.1)

## 2019-06-12 LAB — CBC
HCT: 44.3 % (ref 36.0–46.0)
Hemoglobin: 14.4 g/dL (ref 12.0–15.0)
MCH: 28.1 pg (ref 26.0–34.0)
MCHC: 32.5 g/dL (ref 30.0–36.0)
MCV: 86.5 fL (ref 80.0–100.0)
Platelets: 206 10*3/uL (ref 150–400)
RBC: 5.12 MIL/uL — ABNORMAL HIGH (ref 3.87–5.11)
RDW: 14.2 % (ref 11.5–15.5)
WBC: 8.6 10*3/uL (ref 4.0–10.5)
nRBC: 0 % (ref 0.0–0.2)

## 2019-06-12 LAB — RAPID URINE DRUG SCREEN, HOSP PERFORMED
Amphetamines: NOT DETECTED
Barbiturates: NOT DETECTED
Benzodiazepines: POSITIVE — AB
Cocaine: NOT DETECTED
Opiates: NOT DETECTED
Tetrahydrocannabinol: NOT DETECTED

## 2019-06-12 LAB — SALICYLATE LEVEL: Salicylate Lvl: <7 mg/dL — ABNORMAL LOW (ref 7.0–30.0)

## 2019-06-12 LAB — ETHANOL: Alcohol, Ethyl (B): <10 mg/dL (ref <10)

## 2019-06-12 LAB — ACETAMINOPHEN LEVEL: Acetaminophen (Tylenol), Serum: <10 ug/mL — ABNORMAL LOW (ref 10–30)

## 2019-06-12 MED ORDER — QUETIAPINE FUMARATE 25 MG PO TABS
25.0000 mg | ORAL_TABLET | Freq: Two times a day (BID) | ORAL | Status: DC
  Administered 2019-06-12 – 2019-06-13 (×2): 25 mg via ORAL
  Filled 2019-06-12 (×2): qty 1

## 2019-06-12 MED ORDER — TRAZODONE HCL 50 MG PO TABS
25.0000 mg | ORAL_TABLET | Freq: Every day | ORAL | Status: DC
  Administered 2019-06-12: 25 mg via ORAL
  Filled 2019-06-12: qty 1

## 2019-06-12 MED ORDER — LORATADINE 10 MG PO TABS
10.0000 mg | ORAL_TABLET | Freq: Every day | ORAL | Status: DC
  Administered 2019-06-12 – 2019-06-13 (×2): 10 mg via ORAL
  Filled 2019-06-12 (×2): qty 1

## 2019-06-12 MED ORDER — ZOLPIDEM TARTRATE 5 MG PO TABS: 5.0000 mg | ORAL_TABLET | Freq: Every evening | ORAL | Status: DC | PRN

## 2019-06-12 MED ORDER — CALCIUM CARBONATE-VITAMIN D 500-200 MG-UNIT PO TABS
1.0000 | ORAL_TABLET | Freq: Every day | ORAL | Status: DC
  Administered 2019-06-13: 1 via ORAL
  Filled 2019-06-12 (×2): qty 1

## 2019-06-12 MED ORDER — LEVOTHYROXINE SODIUM 50 MCG PO TABS
75.0000 ug | ORAL_TABLET | Freq: Every day | ORAL | Status: DC
  Administered 2019-06-13: 75 ug via ORAL
  Filled 2019-06-12: qty 2

## 2019-06-12 MED ORDER — ACETAMINOPHEN 325 MG PO TABS: 650.0000 mg | ORAL_TABLET | ORAL | Status: DC | PRN

## 2019-06-12 MED ORDER — ALENDRONATE SODIUM 70 MG PO TABS: 70.0000 mg | ORAL_TABLET | ORAL | Status: DC

## 2019-06-12 MED ORDER — NITROGLYCERIN 0.4 MG SL SUBL: 0.4000 mg | SUBLINGUAL_TABLET | SUBLINGUAL | Status: DC | PRN

## 2019-06-12 MED ORDER — ALPRAZOLAM 0.5 MG PO TABS: 0.5000 mg | ORAL_TABLET | Freq: Three times a day (TID) | ORAL | Status: DC | PRN

## 2019-06-12 MED ORDER — VITAMIN D3 25 MCG (1000 UNIT) PO TABS
2000.0000 [IU] | ORAL_TABLET | Freq: Every day | ORAL | Status: DC
  Administered 2019-06-13: 2000 [IU] via ORAL
  Filled 2019-06-12 (×2): qty 2

## 2019-06-12 MED ORDER — ONDANSETRON HCL 4 MG PO TABS: 4.0000 mg | ORAL_TABLET | Freq: Three times a day (TID) | ORAL | Status: DC | PRN

## 2019-06-12 MED ORDER — SERTRALINE HCL 50 MG PO TABS
200.0000 mg | ORAL_TABLET | Freq: Every day | ORAL | Status: DC
  Administered 2019-06-12 – 2019-06-13 (×2): 200 mg via ORAL
  Filled 2019-06-12 (×2): qty 4

## 2019-06-12 MED ORDER — ALUM & MAG HYDROXIDE-SIMETH 200-200-20 MG/5ML PO SUSP: 30.0000 mL | Freq: Four times a day (QID) | ORAL | Status: DC | PRN
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
## 2019-06-12 NOTE — BH Assessment (Addendum)
Tele Assessment Note   Patient Name: Bailey Alexander MRN: [id number] Referring Physician: Michela Pitcher, PA Location of Patient: [facility] Location of Provider: Behavioral Health TTS Department  Avni Traore is an 49 y.o. female brought to ED via EMS after police were called out to [facility] due to patient making "death threats" towards other relatives. Patient reported being upset due to her sister taking her son away and putting her in [place]. Patient reported she did not threaten any other residents only her sister. Patient reported, "I am mad because my son was taken away from me and I have not seen him in years, I do not like where I live".  Per report, her sister Donney Dice is her legal guardian. Patient reported she does not like the group home. She also stated that she wants to leave and live on her own.  Patient was calm and cooperative during assessment.   Patient denied SI, HI and psychosis. Patient denied prior inpatient psych treatment, suicide attempts and self-harming behaviors. Patient is not currently receiving any mental health outpatient treatment. When asked are you on any psych medications, patient stated "they give me a little brown pill".   Spoke with Kelli Churn, staff at [facility], regarding patients behaviors. Permission was given by patient. BJ stated patient is paranoid, "she is thinking everyone is out to get her". BJ stated, " a sheriff came out for another issue with another resident, after the sheriff left, the patient rolled up to the lady and said I know you called the cop to come get me, I am going to kill you". BJ reported that patient continues to accuse other residents of stealing her things, along with threatening them saying I am going to hit or kill them. BJ reported patient is seeing the in-house psychiatrist and is prescribed Seroquel 2x daily and that her psychosis need to be addressed. Patient continues to state she wants to die. Patient  believes her son is 27 years old and that she needs get home to take care of him. Patient son is in his 35's. BJ stated they are willing to take patient back once she is stabilized.   Diagnosis: Dementia  Past Medical History:  Past Medical History:  Diagnosis Date  . Acute adjustment disorder with disturbance of conduct   . Alcohol dependence (HCC)   . Anemia   . Anxiety disorder   . Bulimia   . Dementia (HCC) 10/2015  . Diabetes mellitus without complication (HCC)   . Facial myokymia   . Hallucinations   . Hyperlipidemia   . Major depressive disorder   . Peripheral vascular disease (HCC)   . 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 Thyroid disease   . Vitamin B12 deficiency   . Vitamin D deficiency     Past Surgical History:  Procedure Laterality Date  . arm surgery Right   . REPLACEMENT TOTAL HIP W/  RESURFACING IMPLANTS Bilateral     Family History:  Family History  Problem Relation Age of Onset  . Arthritis Mother   . COPD Mother   . Cancer Mother        Roda Shutters Cancer  . Depression Mother        Bipolar  . Diabetes Mother   . Hearing loss Mother   . Heart disease Mother   . Hyperlipidemia Mother   . Hypertension Mother   . Kidney disease Mother   . Miscarriages / [place] Mother   .  Arthritis Father   . Asthma Father   . Cancer Father        prostate  . Arthritis Sister   . Breast cancer Sister 30  . Stroke Maternal Grandmother   . Arthritis Paternal Grandmother   . Heart disease Paternal Grandmother   . Heart disease Paternal Grandfather   . Miscarriages / [place] Sister     Social History:  reports that she quit smoking about 3 years ago. Her smoking use included cigarettes. She has never used smokeless tobacco. She reports previous alcohol use. She reports that she does not use drugs.  Additional Social History:  Alcohol / Drug Use Pain Medications: see MAR Prescriptions: see MAR Over the Counter: see  MAR  CIWA: CIWA-Ar BP: (!) 143/72 Pulse Rate: 72 COWS:    Allergies:  Allergies  Allergen Reactions  . Abilify [Aripiprazole]     Unknown reaction-listed on MAR    Home Medications: (Not in a hospital admission)   OB/GYN Status:  No LMP recorded (lmp unknown). Patient is postmenopausal.  General Assessment Data Location of Assessment: [facility] TTS Assessment: In system Is this a Tele or Face-to-Face Assessment?: Tele Assessment Is this an Initial Assessment or a Re-assessment for this encounter?: Initial Assessment Patient Accompanied by:: Other Language Other than English: No Living Arrangements: In Group Home: (Comment: Name of Group Home)([facility]) What gender do you identify as?: Female Marital status: Widowed Pregnancy Status: No Can pt return to current living arrangement?: Yes Admission Status: Voluntary Is patient capable of signing voluntary admission?: Yes Referral Source: Lindaann Pascal)     Crisis Care Plan Legal Guardian: Donney Dice, sister) Name of Psychiatrist: None Name of Therapist: None   Education Status Is patient currently in school?: No Is the patient employed, unemployed or receiving disability?: Receiving disability income  Risk to self with the past 6 months Suicidal Ideation: No Has patient been a risk to self within the past 6 months prior to admission? : No Suicidal Intent: No Has patient had any suicidal intent within the past 6 months prior to admission? : No Is patient at risk for suicide?: No Suicidal Plan?: No Has patient had any suicidal plan within the past 6 months prior to admission? : No Access to Means: No What has been your use of drugs/alcohol within the last 12 months?: (denied) Previous Attempts/Gestures: No Intentional Self Injurious Behavior: None Family Suicide History: Unknown Recent stressful life event(s): Conflict (Comment)(conflict with staff) Persecutory voices/beliefs?: No Depression: No Substance  abuse history and/or treatment for substance abuse?: No Suicide prevention information given to non-admitted patients: Not applicable  Risk to Others within the past 6 months Homicidal Ideation: No Does patient have any lifetime risk of violence toward others beyond the six months prior to admission? : No Thoughts of Harm to Others: No Current Homicidal Intent: No Current Homicidal Plan: No Access to Homicidal Means: No History of harm to others?: No Assessment of Violence: None Noted Violent Behavior Description: (threatening other residents) Does patient have access to weapons?: No Criminal Charges Pending?: No Does patient have a court date: No Is patient on probation?: No  Psychosis Hallucinations: None noted Delusions: None noted  Mental Status Report Appearance/Hygiene: Unremarkable Eye Contact: Fair Motor Activity: Freedom of movement Speech: Logical/coherent Level of Consciousness: Alert Mood: Sad Affect: Appropriate to circumstance Anxiety Level: Minimal Thought Processes: Coherent, Relevant Judgement: Partial Orientation: Person, Place, Time, Situation Obsessive Compulsive Thoughts/Behaviors: None  Cognitive Functioning Concentration: Fair Memory: Recent Intact, Remote Intact Is patient  IDD: No Insight: Poor Impulse Control: Poor Appetite: Fair Have you had any weight changes? : No Change Sleep: Decreased Vegetative Symptoms: None  ADLScreening [facility] Assessment Services) Patient's cognitive ability adequate to safely complete daily activities?: Yes Patient able to express need for assistance with ADLs?: Yes Independently performs ADLs?: Yes (appropriate for developmental age)  Prior Inpatient Therapy Prior Inpatient Therapy: No  Prior Outpatient Therapy Prior Outpatient Therapy: No Does patient have an ACCT team?: No Does patient have Intensive In-House Services?  : No Does patient have [facility] services? : No Does patient have P4CC services?:  No  ADL Screening (condition at time of admission) Patient's cognitive ability adequate to safely complete daily activities?: Yes Patient able to express need for assistance with ADLs?: Yes Independently performs ADLs?: Yes (appropriate for developmental age)  Disposition:  Disposition Initial Assessment Completed for this Encounter: Yes  Lindon Romp, NP, recommends overnight observation for safety and stabilization with psych reassessment in the AM.  This service was provided via telemedicine using a 2-way, interactive audio and video technology.  Names of all persons participating in this telemedicine service and their role in this encounter. Name: Gizel Riedlinger Role: Patient  Name: Kirtland Bouchard Role: TTS Clinician  Name:  Role:   Name:  Role:     Venora Maples 06/12/2019 8:25 PM

## 2019-06-12 NOTE — ED Notes (Signed)
Pt given bag lunch °

## 2019-06-12 NOTE — ED Notes (Signed)
Telemonitoring notified of need for monitoring.

## 2019-06-12 NOTE — ED Notes (Signed)
Pt changed into psych scrubs.  

## 2019-06-12 NOTE — ED Provider Notes (Signed)
[facility] EMERGENCY DEPARTMENT Provider Note   CSN: [id number] Arrival date & time: 06/12/19  1808     History Chief Complaint  Patient presents with  . V70.1   Level 5 caveat due to dementia Bailey Alexander is a 49 y.o. female with history of alcohol dependence, dementia, hyperlipidemia, anorexia nervosa, bulimia, peripheral vascular disease presents from [facility] skilled nursing facility via EMS for evaluation of aggressive behavior.  Reportedly law enforcement was called out today as patient was making death threats towards other residents.  On my assessment the patient tells me "I am mad because my son was taken away from me and I have not seen him in years.  I do not like where I live".  When asked why she states "someone put a finger in my bottom int he shower". She frequently has bizarre answers to questions.  She denies any other complaints.  The history is provided by the patient, medical records and the EMS personnel. The history is limited by the condition of the patient.       Past Medical History:  Diagnosis Date  . Acute adjustment disorder with disturbance of conduct   . Alcohol dependence (HCC)   . Anemia   . Anxiety disorder   . Bulimia   . Dementia (HCC) 10/2015  . Diabetes mellitus without complication (HCC)   . Facial myokymia   . Hallucinations   . Hyperlipidemia   . Major depressive disorder   . Peripheral vascular disease (HCC)   . 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 Thyroid disease   . Vitamin B12 deficiency   . Vitamin D deficiency     Patient Active Problem List   Diagnosis Date Noted  . Hyponatremia 09/05/2017  . Hypokalemia 09/05/2017  . Hypomagnesemia 09/05/2017  . Cardiac enzymes elevated 09/05/2017  . Pulmonary nodule 09/05/2017  . Debility 02/11/2016  . Malnutrition (HCC) 01/31/2016  . Anorexia nervosa 01/31/2016  . Acquired hypothyroidism 01/31/2016  . Dementia (HCC) 10/20/2015    Past  Surgical History:  Procedure Laterality Date  . arm surgery Right   . 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     Family History  Problem Relation Age of Onset  . Arthritis Mother   . COPD Mother   . Cancer Mother        Roda Shutters Cancer  . Depression Mother        Bipolar  . Diabetes Mother   . Hearing loss Mother   . Heart disease Mother   . Hyperlipidemia Mother   . Hypertension Mother   . Kidney disease Mother   . Miscarriages / [place] Mother   . Arthritis Father   . Asthma Father   . Cancer Father        prostate  . Arthritis Sister   . Breast cancer Sister 85  . Stroke Maternal Grandmother   . Arthritis Paternal Grandmother   . Heart disease Paternal Grandmother   . Heart disease Paternal Grandfather   . Miscarriages / [place] Sister     Social History   Tobacco Use  . Smoking status: Former Smoker    Types: Cigarettes    Quit date: 07/31/2015    Years since quitting: 3.8  . Smokeless tobacco: Never Used  Substance Use Topics  . Alcohol use: Not Currently    Comment: Alcohol induced dementia  . Drug use:  No    Home Medications Prior to Admission medications   Medication Sig Start Date End Date Taking? Authorizing Provider  acetaminophen (TYLENOL) 500 MG tablet Take 500 mg by mouth every 8 (eight) hours as needed for mild pain.   Yes [provider]  alendronate (FOSAMAX) 70 MG tablet Take 70 mg by mouth every Friday. Take with a full glass of water on an empty stomach.   Yes [provider]  ALPRAZolam Prudy Feeler) 0.5 MG tablet Take 0.5 mg by mouth in the morning, at noon, and at bedtime.   Yes [provider]  Calcium Carbonate-Vitamin D (CALCIUM HIGH POTENCY/VITAMIN D) 600-200 MG-UNIT TABS Take 1 tablet by mouth daily. 04/13/17  Yes [provider]  Cholecalciferol (VITAMIN D) 50 MCG (2000 UT) CAPS Take 2,000 Units by mouth daily.   Yes [provider]  levothyroxine (SYNTHROID) 75 MCG tablet Take 75 mcg by mouth daily before breakfast.   Yes [provider]  loratadine (CLARITIN) 10 MG tablet Take 10 mg by mouth daily.   Yes [provider]  nitroGLYCERIN (NITROSTAT) 0.4 MG SL tablet Place 0.4 mg under the tongue every 5 (five) minutes as needed for chest pain.   Yes [provider]  QUEtiapine (SEROQUEL) 25 MG tablet Take 1 tablet (25 mg total) by mouth 2 (two) times daily. 05/04/19 06/12/19 Yes Long, Arlyss Repress, MD  sertraline (ZOLOFT) 100 MG tablet Take 200 mg by mouth daily.    Yes [provider]  traZODone (DESYREL) 50 MG tablet Take 25 mg by mouth at bedtime.  03/24/17  Yes [provider]    Allergies    Abilify [aripiprazole]  Review of Systems   Review of Systems  Unable to perform ROS: Dementia    Physical Exam Updated Vital Signs BP (!) 143/72 (BP Location: Right Arm)   Pulse 72   Temp 98 F (36.7 C) (Oral)   Resp 18   Ht 5\' 4"  (1.626 m)   Wt 90 kg   LMP  (LMP Unknown)   SpO2 93%   BMI 34.06 kg/m   Physical Exam Vitals and nursing note reviewed.  Constitutional:      General: She is not in acute distress.    Appearance: She is well-developed.  HENT:     Head: Normocephalic and atraumatic.  Eyes:     General:        Right eye: No discharge.        Left eye: No discharge.     Conjunctiva/sclera: Conjunctivae normal.  Neck:     Vascular: No JVD.     Trachea: No tracheal deviation.  Cardiovascular:     Rate and Rhythm: Normal rate and regular rhythm.  Pulmonary:     Effort: Pulmonary effort is normal.     Breath sounds: Normal breath sounds.  Abdominal:     General: Bowel sounds are normal. There is no distension.     Palpations: Abdomen is soft.     Tenderness: There is no abdominal tenderness. There is no guarding.  Skin:    General: Skin is warm and dry.     Findings: No erythema.  Neurological:     Mental Status: She is alert.    Psychiatric:        Speech: Speech normal.        Behavior: Behavior is agitated. Behavior is cooperative.        Cognition and Memory: Cognition is impaired. Memory is impaired.  ED Results / Procedures / Treatments   Labs (all labs ordered are listed, but only abnormal results are displayed) Labs Reviewed  SALICYLATE LEVEL - Abnormal; Notable for the following components:      Result Value   Salicylate Lvl <2.9 (*)    All other components within normal limits  ACETAMINOPHEN LEVEL - Abnormal; Notable for the following components:   Acetaminophen (Tylenol), Serum <10 (*)    All other components within normal limits  CBC - Abnormal; Notable for the following components:   RBC 5.12 (*)    All other components within normal limits  RAPID URINE DRUG SCREEN, HOSP PERFORMED - Abnormal; Notable for the following components:   Benzodiazepines POSITIVE (*)    All other components within normal limits  SARS CORONAVIRUS 2 BY RT PCR (HOSPITAL ORDER, [facility])  COMPREHENSIVE METABOLIC PANEL  ETHANOL    EKG None  Radiology No results found.  Procedures Procedures (including critical care time)  Medications Ordered in ED Medications  acetaminophen (TYLENOL) tablet 650 mg (has no administration in time range)  zolpidem (AMBIEN) tablet 5 mg (has no administration in time range)  ondansetron (ZOFRAN) tablet 4 mg (has no administration in time range)  alum & mag hydroxide-simeth (MAALOX/MYLANTA) 200-200-20 MG/5ML suspension 30 mL (has no administration in time range)  alendronate (FOSAMAX) tablet 70 mg (has no administration in time range)  ALPRAZolam (XANAX) tablet 0.5 mg (has no administration in time range)  Calcium Carbonate-Vitamin D 600-200 MG-UNIT TABS 1 tablet (1 tablet Oral Not Given 06/12/19 2131)  Vitamin D CAPS 2,000 Units (2,000 Units Oral Not Given 06/12/19 2131)  levothyroxine (SYNTHROID) tablet 75 mcg (has no administration in time range)   loratadine (CLARITIN) tablet 10 mg (10 mg Oral Given 06/12/19 2125)  nitroGLYCERIN (NITROSTAT) SL tablet 0.4 mg (has no administration in time range)  QUEtiapine (SEROQUEL) tablet 25 mg (25 mg Oral Given 06/12/19 2124)  sertraline (ZOLOFT) tablet 200 mg (200 mg Oral Given 06/12/19 2124)  traZODone (DESYREL) tablet 25 mg (25 mg Oral Given 06/12/19 2125)    ED Course  I have reviewed the triage vital signs and the nursing notes.  Pertinent labs & imaging results that were available during my care of the patient were reviewed by me and considered in my medical decision making (see chart for details).    MDM Rules/Calculators/A&P                      Patient presents from skilled nursing facility for aggressive behavior, possible homicidal ideations towards residents.  She is afebrile, vital signs are stable.  She is nontoxic in appearance.  Physical examination is reassuring.  Screening labs reviewed and interpreted by myself show no leukocytosis, no anemia, no metabolic derangements, no renal insufficiency.  UDS is positive for benzodiazepines which she is prescribed.  She is medically cleared for TTS evaluation at this time.  TTS recommends overnight observation with a.m. psychiatric reevaluation.  Of note patient will need to be IVC if she attempts to leave prior to psychiatric evaluation.  Care signed out to default provider.  Pending Covid test.   Final Clinical Impression(s) / ED Diagnoses Final diagnoses:  Aggressive behavior    Rx / DC Orders ED Discharge Orders    None       Renita Papa, PA-C 06/12/19 2203    Maudie Flakes, MD 06/16/19 2324

## 2019-06-12 NOTE — ED Notes (Signed)
Telemonitoring notified.

## 2019-06-12 NOTE — ED Triage Notes (Signed)
Pt brought to ED via [facility] for HI. Pt lives at [facility] and Patent examiner was called out today for pt making "death threats" towards other residents. Pt states "I didn't threaten that woman, I threatened Marylene Land because she put me in that place under false pretenses and took my son away." Pt denies SI.

## 2019-06-12 NOTE — ED Notes (Signed)
TTS assessment completed. Jason Berry, NP, recommends overnight observation for safety and stabilization with psych reassessment in the AM. 

## 2019-06-12 NOTE — ED Notes (Signed)
Pt yelling in the room. RN and officer at bedside. Pt states no one has helped me I want to eat. Informed pt she can not be yelling out.

## 2019-06-12 NOTE — ED Notes (Signed)
Pt belongings were put in locker. 1 outfit. 1 pouch of fake money. One arm band and a ring

## 2019-06-12 NOTE — ED Notes (Signed)
TTS in progress 

## 2019-06-13 NOTE — Progress Notes (Signed)
CSW has left voice message with nursing home supervisor requesting a return phone call.   Wells Guiles, LCSW, LCAS Disposition CSW [facility]/TTS [phone]

## 2019-06-13 NOTE — Progress Notes (Signed)
Patient ID: Bailey Alexander, female   DOB: 1970/05/09, 49 y.o.   MRN: [id number]   Psychiatric reassessment   [id number] Bailey Alexander is an 49 y.o. female brought to ED via EMS after police were called out to [facility] due to patient making "death threats" towards other relatives. Patient reported being upset due to her sister taking her son away and putting her in [place]. Patient reported she did not threaten any other residents only her sister. Patient reported, "I am mad because my son was taken away from me and I have not seen him in years,I do not like where I live". Per report, her sister Bailey Alexander is her legal guardian.Patient reported she does not like the group home. She also stated that she wants to leave and live on her own. Patient was calm and cooperative during assessment.   Patient denied SI, HI and psychosis. Patient denied prior inpatient psych treatment, suicide attempts and self-harming behaviors. Patient is not currently receiving any mental health outpatient treatment. When asked are you on any psych medications, patient stated "they give me a little brown pill".   Spoke with Bailey Alexander, staff at [facility], regarding patients behaviors. Permission was given by patient. Bailey Alexander stated patient is paranoid, "she is thinking everyone is out to get her". Bailey Alexander stated, " a sheriff came out for another issue with another resident, after the sheriff left, the patient rolled up to the lady and said I know you called the cop to come get me, I am going to kill you". Bailey Alexander reported that patient continues to accuse other residents of stealing her things, along with threatening them saying I am going to hit or kill them. Bailey Alexander reported patient is seeing the in-house psychiatrist and is prescribed Seroquel 2x daily and that her psychosis need to be addressed. Patient continues to state she wants to die. Patient believes her son is 87 years old and that she needs get home to take care of  him. Patient son is in his 9's. Bailey Alexander stated they are willing to take patient back once she is stabilized.   Psychiatric evaluation: Bailey Alexander is a 49 year old female who presented to [facility] for concerns as noted above. During this evaluation,she was alert and oriented x2 (person and time; was confused about where se was and made the comment that she had been in the hospital for three years). She was calm and cooperative. She stated that she was taken to the ED after her sister, who is her guardian, told her she could not speak to her son. Her son, who was reported to be age 26 (patient stated 85 or 1), per patient report, could be heard in the background. She stated this caused her to become upset so she told her sister that if she didn't let her talk to her son, she would sue her. She denied making death threats. She further denied current SI, HI and psychosis to include feelings of paranoia. .She stated that she has been living in [facility] for years and stated she does not want to live there anymore.and she was upset with her sister for placing her there. She denied concerns with sleep or appetite. Reported she does take medications but was unable to recall the the names. Stated that she has never been psychiatrically hospitalized before and denied prior  suicide attempts and self-harming behaviors. Denied other concerns during this evaluation.   Disposition: Patient denied SI, HI or psychosis. At  one point, she did say that her son was 33 or 74 and say that she had been in the hospital for three years however, during the evaluation, there were no signs that she was responding  to internal stimuli. Per review of chart, TTS counselor obtained collateral information from Bailey Alexander, staff at [facility] who reported concerns that patient was paranoid. He added that  atient is seeing the in-house psychiatrist and is prescribed Seroquel 2x daily and that her psychosis need to be addressed.  It is recommended that staff speak to patients psychiatrist to address their  concerns in regard to  psychosis.   At this time, there is no evidence of imminent risk to self or others at present.   Patient does not meet criteria for psychiatric inpatient admission and is therefore psychiatrically cleared.   EDP updated on disposition.   To note, I attempted to reach Bailey Alexander, staff at [facility], [phone] to discuss disposition however, attempt was unsuccessful. Voice message was left for a return phone call. Once reached, I will discuss disposition and safety plan.

## 2019-06-13 NOTE — Discharge Instructions (Addendum)
Follow up per behavioral health. 

## 2019-06-13 NOTE — ED Notes (Signed)
Call EMS [facility] to transfer Pt back to [facility].

## 2019-06-13 NOTE — ED Notes (Signed)
EMS here to transport Pt back to [facility].

## 2019-06-13 NOTE — Progress Notes (Signed)
CSW received phone call from [facility] of [place]. She stated that once psychiatrically cleared, pt can return to the facility anytime. CSW will notify [facility] NP.   Wells Guiles, LCSW, LCAS Disposition CSW [facility]/TTS [phone]

## 2019-06-13 NOTE — ED Notes (Signed)
[facility] was called at this time by [facility] staff to see if any updated information on trying to reach someone at [facility]. Social worker will try to call nursing home again and call us back with any updates.

## 2019-06-13 NOTE — ED Notes (Signed)
Spoke with Recruitment consultant. Patient placed on a pure wick because she feels weak and struggled to get to the bedside commode. Telesitter called and given updated contact information and informed patient was placed on pure wick.
